# Patient Record
Sex: Female | Born: 1945 | Race: Black or African American | Hispanic: No | Smoking: Never smoker
Health system: Southern US, Community
[De-identification: ages and names within clinical notes are randomized; demographics above are authoritative.]

## PROBLEM LIST (undated history)

## (undated) DIAGNOSIS — M1990 Unspecified osteoarthritis, unspecified site: Secondary | ICD-10-CM

## (undated) DIAGNOSIS — H269 Unspecified cataract: Secondary | ICD-10-CM

## (undated) DIAGNOSIS — I252 Old myocardial infarction: Secondary | ICD-10-CM

## (undated) DIAGNOSIS — I509 Heart failure, unspecified: Secondary | ICD-10-CM

## (undated) DIAGNOSIS — R0683 Snoring: Secondary | ICD-10-CM

## (undated) DIAGNOSIS — E119 Type 2 diabetes mellitus without complications: Secondary | ICD-10-CM

## (undated) DIAGNOSIS — T7840XA Allergy, unspecified, initial encounter: Secondary | ICD-10-CM

## (undated) DIAGNOSIS — R600 Localized edema: Secondary | ICD-10-CM

## (undated) DIAGNOSIS — D649 Anemia, unspecified: Secondary | ICD-10-CM

## (undated) DIAGNOSIS — E861 Hypovolemia: Secondary | ICD-10-CM

## (undated) DIAGNOSIS — I10 Essential (primary) hypertension: Secondary | ICD-10-CM

## (undated) DIAGNOSIS — I2510 Atherosclerotic heart disease of native coronary artery without angina pectoris: Secondary | ICD-10-CM

## (undated) DIAGNOSIS — E782 Mixed hyperlipidemia: Secondary | ICD-10-CM

## (undated) HISTORY — PX: OTHER SURGICAL HISTORY: SHX169

## (undated) HISTORY — DX: Localized edema: R60.0

## (undated) HISTORY — DX: Allergy, unspecified, initial encounter: T78.40XA

## (undated) HISTORY — PX: EYE SURGERY: SHX253

## (undated) HISTORY — DX: Unspecified osteoarthritis, unspecified site: M19.90

## (undated) HISTORY — PX: CARDIAC SURGERY: SHX584

## (undated) HISTORY — DX: Heart failure, unspecified: I50.9

## (undated) HISTORY — DX: Mixed hyperlipidemia: E78.2

## (undated) HISTORY — DX: Snoring: R06.83

## (undated) HISTORY — DX: Hypovolemia: E86.1

## (undated) HISTORY — DX: Anemia, unspecified: D64.9

---

## 2016-12-30 ENCOUNTER — Emergency Department (HOSPITAL_COMMUNITY): Payer: Medicare Other

## 2016-12-30 ENCOUNTER — Emergency Department (HOSPITAL_COMMUNITY)
Admission: EM | Admit: 2016-12-30 | Discharge: 2016-12-30 | Disposition: A | Discharge status: Home / Self Care | Payer: Medicare Other | Attending: Emergency Medicine | Admitting: Emergency Medicine

## 2016-12-30 ENCOUNTER — Encounter (HOSPITAL_COMMUNITY): Payer: Self-pay | Admitting: Emergency Medicine

## 2016-12-30 DIAGNOSIS — Y9259 Other trade areas as the place of occurrence of the external cause: Secondary | ICD-10-CM | POA: Diagnosis not present

## 2016-12-30 DIAGNOSIS — W108XXA Fall (on) (from) other stairs and steps, initial encounter: Secondary | ICD-10-CM | POA: Insufficient documentation

## 2016-12-30 DIAGNOSIS — Y9301 Activity, walking, marching and hiking: Secondary | ICD-10-CM | POA: Diagnosis not present

## 2016-12-30 DIAGNOSIS — S161XXA Strain of muscle, fascia and tendon at neck level, initial encounter: Secondary | ICD-10-CM | POA: Insufficient documentation

## 2016-12-30 DIAGNOSIS — R0789 Other chest pain: Secondary | ICD-10-CM | POA: Diagnosis not present

## 2016-12-30 DIAGNOSIS — S0990XA Unspecified injury of head, initial encounter: Secondary | ICD-10-CM | POA: Insufficient documentation

## 2016-12-30 DIAGNOSIS — Y998 Other external cause status: Secondary | ICD-10-CM | POA: Diagnosis not present

## 2016-12-30 DIAGNOSIS — Z7982 Long term (current) use of aspirin: Secondary | ICD-10-CM | POA: Insufficient documentation

## 2016-12-30 DIAGNOSIS — Z79899 Other long term (current) drug therapy: Secondary | ICD-10-CM | POA: Diagnosis not present

## 2016-12-30 DIAGNOSIS — W19XXXA Unspecified fall, initial encounter: Secondary | ICD-10-CM

## 2016-12-30 LAB — BASIC METABOLIC PANEL
ANION GAP: 8 (ref 5–15)
BUN: 17 mg/dL (ref 6–20)
CHLORIDE: 103 mmol/L (ref 101–111)
CO2: 26 mmol/L (ref 22–32)
Calcium: 9.3 mg/dL (ref 8.9–10.3)
Creatinine, Ser: 0.79 mg/dL (ref 0.44–1.00)
GFR calc non Af Amer: >60 mL/min (ref >60)
Glucose, Bld: 145 mg/dL — ABNORMAL HIGH (ref 65–99)
Potassium: 3.7 mmol/L (ref 3.5–5.1)
SODIUM: 137 mmol/L (ref 135–145)

## 2016-12-30 LAB — PROTIME-INR
INR: 0.93
PROTHROMBIN TIME: 12.3 s (ref 11.4–15.2)

## 2016-12-30 LAB — TROPONIN I

## 2016-12-30 LAB — CBC
HCT: 35.5 % — ABNORMAL LOW (ref 36.0–46.0)
HEMOGLOBIN: 11.9 g/dL — AB (ref 12.0–15.0)
MCH: 27.3 pg (ref 26.0–34.0)
MCHC: 33.5 g/dL (ref 30.0–36.0)
MCV: 81.4 fL (ref 78.0–100.0)
PLATELETS: 189 10*3/uL (ref 150–400)
RBC: 4.36 MIL/uL (ref 3.87–5.11)
RDW: 16.1 % — ABNORMAL HIGH (ref 11.5–15.5)
WBC: 5.4 10*3/uL (ref 4.0–10.5)

## 2016-12-30 LAB — I-STAT TROPONIN, ED: TROPONIN I, POC: 0 ng/mL (ref 0.00–0.08)

## 2016-12-30 NOTE — Discharge Instructions (Signed)
Expect to be sore and stiff for the next few days.  Workup here without any acute injuries.  Take Tylenol for the pain.  Follow-up for any new or worse symptoms.

## 2016-12-30 NOTE — ED Notes (Signed)
Patient given discharge instructions and verbalized understanding.  Patient stable to discharge at this time.  Patient is alert and oriented to baseline.  No distressed noted at this time.  All belongings taken with the patient at discharge.   

## 2016-12-30 NOTE — ED Provider Notes (Signed)
[facility] EMERGENCY DEPARTMENT Provider Note   CSN: [id number] Arrival date & time: 12/30/16  1153     History   Chief Complaint Chief Complaint  Patient presents with  . Chest Pain    HPI Dana Beltran is a 71 y.o. female.  [facility] the science center and was going downstairs and tripped and hit her forehead on the wall.  Patient with complaint of some mild neck stiffness and pain.  Also right-sided chest pain that started right after the fall.  Patient went to [facility] for treatment.  And was referred here for further evaluation.  I think they were concerned about the right-sided chest pain.  Was brought in by EMS after 911 call.  Patient was given 324 mg of aspirin.  No loss of consciousness      History reviewed. No pertinent past medical history.  There are no active problems to display for this patient.   History reviewed. No pertinent surgical history.  OB History    No data available       Home Medications    Prior to Admission medications   Medication Sig Start Date End Date Taking? Authorizing Provider  amLODipine (NORVASC) 10 MG tablet Take 10 mg by mouth daily. 10/17/16  Yes [provider]  Ascorbic Acid (VITAMIN C) 1000 MG tablet Take 1,000 mg by mouth daily.   Yes [provider]  aspirin EC 81 MG tablet Take 81 mg by mouth daily.   Yes [provider]  atorvastatin (LIPITOR) 40 MG tablet Take 40 mg by mouth daily. 10/17/16  Yes [provider]  cholecalciferol (VITAMIN D) 1000 units tablet Take 1,000 Units by mouth daily.   Yes [provider]  co-enzyme Q-10 50 MG capsule Take 50 mg by mouth daily.   Yes [provider]  metoprolol succinate (TOPROL-XL) 25 MG 24 hr tablet Take 25 mg by mouth daily.   Yes [provider]    Family History History reviewed. No pertinent family history.  Social History Social History   Tobacco Use  . Smoking  status: Not on file  Substance Use Topics  . Alcohol use: Not on file  . Drug use: Not on file     Allergies   Chocolate and Citrus   Review of Systems Review of Systems  Constitutional: Negative for fever.  HENT: Negative for congestion.   Eyes: Negative for visual disturbance.  Respiratory: Negative for shortness of breath.   Cardiovascular: Positive for chest pain.  Gastrointestinal: Negative for abdominal pain.  Genitourinary: Negative for dysuria.  Musculoskeletal: Positive for neck pain. Negative for back pain.  Neurological: Positive for headaches. Negative for syncope.     Physical Exam Updated Vital Signs BP (!) 162/88   Pulse (!) 54   Temp 98.3 F (36.8 C) (Oral)   Resp (!) 22   Ht 1.575 m (5\' 2" )   Wt 102.1 kg (225 lb)   SpO2 98%   BMI 41.15 kg/m   Physical Exam  Constitutional: She is oriented to person, place, and time. She appears well-developed and well-nourished. No distress.  HENT:  Large hematoma on right side of forehead probably measuring about 6 x 6 cm little bit of contusion no abrasion  Eyes: Conjunctivae and EOM are normal. Pupils are equal, round, and reactive to light.  Neck: Normal range of motion.  Mild tenderness to palpation posterior cervical spine  Cardiovascular: Normal rate, regular rhythm and normal heart sounds.  Pulmonary/Chest: Effort normal and breath sounds normal. No respiratory distress. She exhibits tenderness.  Reproducible tenderness right anterior chest no crepitance  Abdominal: Soft. Bowel sounds are normal. There is no tenderness.  Musculoskeletal: Normal range of motion. She exhibits no tenderness.  Neurological: She is alert and oriented to person, place, and time. No cranial nerve deficit or sensory deficit. She exhibits normal muscle tone. Coordination normal.  Skin: Skin is warm.  Nursing note and vitals reviewed.    ED Treatments / Results  Labs (all labs ordered are listed, but only abnormal results are  displayed) Labs Reviewed  BASIC METABOLIC PANEL - Abnormal; Notable for the following components:      Result Value   Glucose, Bld 145 (*)    All other components within normal limits  CBC - Abnormal; Notable for the following components:   Hemoglobin 11.9 (*)    HCT 35.5 (*)    RDW 16.1 (*)    All other components within normal limits  TROPONIN I  PROTIME-INR  I-STAT TROPONIN, ED    EKG  EKG Interpretation  Date/Time:  Tuesday December 30 2016 12:10:53 EST Ventricular Rate:  57 PR Interval:    QRS Duration: 95 QT Interval:  420 QTC Calculation: 409 R Axis:   25 Text Interpretation:  Sinus rhythm Probable left ventricular hypertrophy Abnormal T, consider ischemia, lateral leads No previous ECGs available Confirmed by Vanetta MuldersZackowski, Lylith Bebeau [id number]) on 12/30/2016 12:18:19 PM       Radiology Dg Chest 2 View  Result Date: 12/30/2016 CLINICAL DATA:  Larey SeatFell with right chest discomfort. EXAM: CHEST  2 VIEW COMPARISON:  None. FINDINGS: Heart size is mildly enlarged. The lungs are clear without a pneumothorax. Trachea is midline. No large pleural effusions. Multilevel degenerative changes in the thoracic spine with bridging osteophytes. Ribs appear to be grossly intact on these chest images. IMPRESSION: No active cardiopulmonary disease. Mild cardiomegaly. Electronically Signed   By: Richarda OverlieAdam  Henn M.D.   On: 12/30/2016 13:34   Ct Head Wo Contrast  Result Date: 12/30/2016 CLINICAL DATA:  Head injury after fall. EXAM: CT HEAD WITHOUT CONTRAST CT CERVICAL SPINE WITHOUT CONTRAST TECHNIQUE: Multidetector CT imaging of the head and cervical spine was performed following the standard protocol without intravenous contrast. Multiplanar CT image reconstructions of the cervical spine were also generated. COMPARISON:  None. FINDINGS: CT HEAD FINDINGS Brain: No evidence of acute infarction, hemorrhage, hydrocephalus, extra-axial collection or mass lesion/mass effect. Vascular: No hyperdense vessel or  unexpected calcification. Skull: Normal. Negative for fracture or focal lesion. Sinuses/Orbits: No acute finding. Other: Mild right frontal scalp hematoma is noted. CT CERVICAL SPINE FINDINGS Alignment: Normal. Skull base and vertebrae: No fracture is noted. Large anterior osteophyte formation is noted at C3-4, C4-5, C5-6 and C6-7. Soft tissues and spinal canal: No prevertebral fluid or swelling. No visible canal hematoma. Disc levels: Mild degenerative disc disease is noted at C3-4 and C6-7. Upper chest: Negative. Other: None. IMPRESSION: Mild right frontal scalp hematoma. No acute intracranial abnormality seen. Multilevel degenerative disc disease. No acute abnormality seen in the cervical spine. Electronically Signed   By: Lupita RaiderJames  Green Jr, M.D.   On: 12/30/2016 14:38   Ct Cervical Spine Wo Contrast  Result Date: 12/30/2016 CLINICAL DATA:  Head injury after fall. EXAM: CT HEAD WITHOUT CONTRAST CT CERVICAL SPINE WITHOUT CONTRAST TECHNIQUE: Multidetector CT imaging of the head and cervical spine was performed following the standard protocol without intravenous contrast. Multiplanar CT image reconstructions of the cervical spine were also generated. COMPARISON:  None. FINDINGS: CT HEAD FINDINGS Brain: No evidence of acute infarction, hemorrhage, hydrocephalus, extra-axial collection or mass lesion/mass effect. Vascular: No hyperdense vessel or unexpected calcification. Skull: Normal. Negative for fracture or focal lesion. Sinuses/Orbits: No acute finding. Other: Mild right frontal scalp hematoma is noted. CT CERVICAL SPINE FINDINGS Alignment: Normal. Skull base and vertebrae: No fracture is noted. Large anterior osteophyte formation is noted at C3-4, C4-5, C5-6 and C6-7. Soft tissues and spinal canal: No prevertebral fluid or swelling. No visible canal hematoma. Disc levels: Mild degenerative disc disease is noted at C3-4 and C6-7. Upper chest: Negative. Other: None. IMPRESSION: Mild right frontal scalp  hematoma. No acute intracranial abnormality seen. Multilevel degenerative disc disease. No acute abnormality seen in the cervical spine. Electronically Signed   By: Lupita RaiderJames  Green Jr, M.D.   On: 12/30/2016 14:38    Procedures Procedures (including critical care time)  Medications Ordered in ED Medications - No data to display   Initial Impression / Assessment and Plan / ED Course  I have reviewed the triage vital signs and the nursing notes.  Pertinent labs & imaging results that were available during my care of the patient were reviewed by me and considered in my medical decision making (see chart for details).    Patient symptoms all seem to be related to the fall.  I think the chest pain is due to a right chest wall pain from the fall.  Very reproducible.  Patient's EKG negative troponin negative but there is some question with the patient was on blood thinners.  But INR is normal.  Head CT negative CT of cervical spine negative.  Patient may have some contusions to the ribs on the right side.  Chest x-ray showed no evidence of pneumothorax.  No obvious evidence of rib fractures.  Patient stable for discharge home.  Patient offered hydrocodone but patient does not want anything other than Tylenol.   Final Clinical Impressions(s) / ED Diagnoses   Final diagnoses:  Fall, initial encounter  Injury of head, initial encounter  Cervical strain, acute, initial encounter  Chest wall pain    ED Discharge Orders    None       Vanetta MuldersZackowski, Merissa Renwick, MD 12/30/16 1456

## 2016-12-30 NOTE — ED Triage Notes (Signed)
Per EMS pt was at the science center and tripped over a stair and hit her forehead and has a hematoma.  [facility] and started having Right sided chest pain that does not radiate anywhere. The med center called 911.  In route via EMS she received 324 Asprin.  AOx4 NAD noted at this time.

## 2018-04-07 ENCOUNTER — Other Ambulatory Visit: Payer: Self-pay

## 2018-04-07 DIAGNOSIS — I10 Essential (primary) hypertension: Secondary | ICD-10-CM

## 2018-04-07 MED ORDER — AMLODIPINE BESYLATE 10 MG PO TABS: 10.0000 mg | ORAL_TABLET | Freq: Every day | ORAL | 3 refills | Status: DC

## 2018-06-24 ENCOUNTER — Other Ambulatory Visit: Payer: Self-pay

## 2018-09-05 ENCOUNTER — Other Ambulatory Visit: Payer: Self-pay | Admitting: Cardiology

## 2018-10-30 ENCOUNTER — Emergency Department (HOSPITAL_COMMUNITY)
Admission: EM | Admit: 2018-10-30 | Discharge: 2018-10-30 | Disposition: A | Discharge status: Home / Self Care | Payer: Medicare Other | Attending: Emergency Medicine | Admitting: Emergency Medicine

## 2018-10-30 ENCOUNTER — Encounter (HOSPITAL_COMMUNITY): Payer: Self-pay | Admitting: *Deleted

## 2018-10-30 ENCOUNTER — Other Ambulatory Visit: Payer: Self-pay

## 2018-10-30 ENCOUNTER — Emergency Department (HOSPITAL_COMMUNITY): Payer: Medicare Other

## 2018-10-30 DIAGNOSIS — I2510 Atherosclerotic heart disease of native coronary artery without angina pectoris: Secondary | ICD-10-CM | POA: Insufficient documentation

## 2018-10-30 DIAGNOSIS — Z79899 Other long term (current) drug therapy: Secondary | ICD-10-CM | POA: Diagnosis not present

## 2018-10-30 DIAGNOSIS — I252 Old myocardial infarction: Secondary | ICD-10-CM | POA: Diagnosis not present

## 2018-10-30 DIAGNOSIS — I10 Essential (primary) hypertension: Secondary | ICD-10-CM | POA: Insufficient documentation

## 2018-10-30 DIAGNOSIS — R51 Headache: Secondary | ICD-10-CM | POA: Insufficient documentation

## 2018-10-30 DIAGNOSIS — Z7982 Long term (current) use of aspirin: Secondary | ICD-10-CM | POA: Diagnosis not present

## 2018-10-30 DIAGNOSIS — G8929 Other chronic pain: Secondary | ICD-10-CM

## 2018-10-30 HISTORY — DX: Unspecified cataract: H26.9

## 2018-10-30 HISTORY — DX: Old myocardial infarction: I25.2

## 2018-10-30 HISTORY — DX: Atherosclerotic heart disease of native coronary artery without angina pectoris: I25.10

## 2018-10-30 HISTORY — DX: Essential (primary) hypertension: I10

## 2018-10-30 MED ORDER — METOCLOPRAMIDE HCL 5 MG/ML IJ SOLN
10.0000 mg | Freq: Once | INTRAMUSCULAR | Status: DC
  Filled 2018-10-30: qty 2

## 2018-10-30 MED ORDER — DIPHENHYDRAMINE HCL 25 MG PO CAPS
25.0000 mg | ORAL_CAPSULE | Freq: Once | ORAL | Status: AC
  Administered 2018-10-30: 25 mg via ORAL
  Filled 2018-10-30: qty 1

## 2018-10-30 MED ORDER — METOCLOPRAMIDE HCL 10 MG PO TABS
5.0000 mg | ORAL_TABLET | Freq: Once | ORAL | Status: AC
  Administered 2018-10-30: 5 mg via ORAL
  Filled 2018-10-30: qty 1

## 2018-10-30 MED ORDER — DIPHENHYDRAMINE HCL 50 MG/ML IJ SOLN
25.0000 mg | Freq: Once | INTRAMUSCULAR | Status: DC
  Filled 2018-10-30: qty 1

## 2018-10-30 MED ORDER — SODIUM CHLORIDE 0.9 % IV BOLUS: 500.0000 mL | Freq: Once | INTRAVENOUS | Status: DC

## 2018-10-30 NOTE — ED Triage Notes (Signed)
C/o headache onset May states she  Was seen by her pcp on tues and seen to the eye doctor, states she called her doctor yest and told to come to the ed for further eval.

## 2018-10-30 NOTE — ED Notes (Signed)
Dc instructions given to pt pt verbalizes understanding.

## 2018-10-30 NOTE — ED Provider Notes (Signed)
[facility] EMERGENCY DEPARTMENT Provider Note   CSN: [id number] Arrival date & time: 10/30/18  0749     History   Chief Complaint Chief Complaint  Patient presents with  . Headache    HPI Dana Beltran is a 73 y.o. female past medical history of cataracts, CAD, hypertension, MI who presents for evaluation of headache that is been constant for the last 4 months.  She states that initially started in May 2020.  She does not recall any preceding trauma, injury, fall.  She reports that it started out gradual in nature and then progressively worsened.  She has not taken any medication for the symptoms.  She saw her PCP who advised her to go to the ophthalmologist doctor.  At the ophthalmologist, she was noted to have cataracts but no other abnormalities.  She states her headache has continued and that she has not had 1 day where she has been headache free.  She states that she called her family medicine doctor who advised her to come to the emergency department for further evaluation.  She states that she will occasionally get sharp pains to the top of her head.  She describes the headache in the front of her forehead and bilateral sides as well as in the back as a pressure type sensation.  She states it is worsened by laying back or leaning too far forward.  She has not had any fevers, double vision, vision loss, blurry vision.  She states she has not had any associated nausea/vomiting, chest pain, difficulty breathing, numbness/weakness of arms or legs, difficulty walking.      The history is provided by the patient.    Past Medical History:  Diagnosis Date  . Cataract disorder type 14   . Coronary artery disease   . Hypertension   . MI, old     There are no active problems to display for this patient.   History reviewed. No pertinent surgical history.   OB History   No obstetric history on file.      Home Medications    Prior to Admission medications    Medication Sig Start Date End Date Taking? Authorizing Provider  amLODipine (NORVASC) 10 MG tablet Take 1 tablet (10 mg total) by mouth daily. 04/07/18   Adrian Prows, MD  Ascorbic Acid (VITAMIN C) 1000 MG tablet Take 1,000 mg by mouth daily.    [provider]  aspirin EC 81 MG tablet Take 81 mg by mouth daily.    [provider]  atorvastatin (LIPITOR) 40 MG tablet TAKE 1 TABLET BY MOUTH  DAILY 09/06/18   Adrian Prows, MD  cholecalciferol (VITAMIN D) 1000 units tablet Take 1,000 Units by mouth daily.    [provider]  co-enzyme Q-10 50 MG capsule Take 50 mg by mouth daily.    [provider]  metoprolol succinate (TOPROL-XL) 25 MG 24 hr tablet Take 25 mg by mouth daily.    [provider]    Family History No family history on file.  Social History Social History   Tobacco Use  . Smoking status: Never Smoker  . Smokeless tobacco: Never Used  Substance Use Topics  . Alcohol use: Never    Frequency: Never  . Drug use: Never     Allergies   Chocolate and Citrus   Review of Systems Review of Systems  Constitutional: Negative for fever.  Eyes: Negative for visual disturbance.  Respiratory: Negative for shortness of breath.   Cardiovascular:  Negative for chest pain.  Gastrointestinal: Negative for abdominal pain, nausea and vomiting.  Neurological: Positive for headaches. Negative for weakness and numbness.  All other systems reviewed and are negative.    Physical Exam Updated Vital Signs BP 129/71   Pulse 76   Temp 98.6 F (37 C) (Oral)   Resp 16   Ht 5' 2" (1.575 m)   Wt 100.7 kg   SpO2 100%   BMI 40.60 kg/m   Physical Exam Vitals signs and nursing note reviewed.  Constitutional:      Appearance: Normal appearance. She is well-developed.  HENT:     Head: Normocephalic and atraumatic.      Comments: Tenderness palpation on bilateral temples. Eyes:     General: Lids are normal.     Conjunctiva/sclera: Conjunctivae  normal.     Pupils: Pupils are equal, round, and reactive to light.     Comments: PERRL. EOMs intact. No nystagmus. No neglect.   Neck:     Musculoskeletal: Full passive range of motion without pain.     Comments: Neck is supple without rigidity. Cardiovascular:     Rate and Rhythm: Normal rate and regular rhythm.     Pulses: Normal pulses.     Heart sounds: Normal heart sounds. No murmur. No friction rub. No gallop.   Pulmonary:     Effort: Pulmonary effort is normal.     Breath sounds: Normal breath sounds.     Comments: Lungs clear to auscultation bilaterally.  Symmetric chest rise.  No wheezing, rales, rhonchi. Abdominal:     Palpations: Abdomen is soft. Abdomen is not rigid.     Tenderness: There is no abdominal tenderness. There is no guarding.     Comments: Abdomen is soft, non-distended, non-tender. No rigidity, No guarding. No peritoneal signs.  Musculoskeletal: Normal range of motion.  Skin:    General: Skin is warm and dry.     Capillary Refill: Capillary refill takes less than 2 seconds.  Neurological:     Mental Status: She is alert and oriented to person, place, and time.     Comments: Cranial nerves III-XII intact Follows commands, Moves all extremities  5/5 strength to BUE and BLE  Sensation intact throughout all major nerve distributions Normal finger to nose. No pronator drift. No gait abnormalities  No slurred speech. No facial droop.   Psychiatric:        Speech: Speech normal.      ED Treatments / Results  Labs (all labs ordered are listed, but only abnormal results are displayed) Labs Reviewed  SEDIMENTATION RATE    EKG None  Radiology Ct Head Wo Contrast  Result Date: 10/30/2018 CLINICAL DATA:  Headache, chronic. EXAM: CT HEAD WITHOUT CONTRAST TECHNIQUE: Contiguous axial images were obtained from the base of the skull through the vertex without intravenous contrast. COMPARISON:  None. FINDINGS: Brain: No evidence of acute infarction,  hemorrhage, hydrocephalus, extra-axial collection or mass lesion/mass effect. Vascular: No hyperdense vessel or unexpected calcification. Skull: Normal. Negative for fracture or focal lesion. Sinuses/Orbits: No acute finding. Other: None. IMPRESSION: 1. No acute intracranial abnormality.  Normal brain. Electronically Signed   By: Kerby Moors M.D.   On: 10/30/2018 11:25    Procedures Procedures (including critical care time)  Medications Ordered in ED Medications  metoCLOPramide (REGLAN) tablet 5 mg (5 mg Oral Given 10/30/18 1210)  diphenhydrAMINE (BENADRYL) capsule 25 mg (25 mg Oral Given 10/30/18 1209)     Initial Impression / Assessment and Plan / ED  Course  I have reviewed the triage vital signs and the nursing notes.  Pertinent labs & imaging results that were available during my care of the patient were reviewed by me and considered in my medical decision making (see chart for details).        73 year old female who presents for evaluation of headache that is been constant since May 2020.  No preceding trauma, injury, fall.  No associated fevers, vision changes, numbness/weakness of her arms or legs, difficulty walking.  Reports it is worse with laying back or sitting all the way forward. Patient is afebrile, non-toxic appearing, sitting comfortably on examination table. Vital signs reviewed and stable.  No neuro deficits.  History/physical exam concerning for intracranial hemorrhage, meningitis.  Doubt trigeminal neuralgia.  Given continuation of headache for the last several months, will plan for CT head for evaluation of any acute intracranial normality.  CT head negative for any acute abnormality.  Discussed results with patient.  We will plan to give migraine cocktail.  She does not want the IV.  We will give it orally and check ESR.  Patient reports feeling much better after medications here in the ED.  Her ESR has not been drawn yet but patient would like to leave.  I  discussed with her that we wanted to evaluate one more blood test but patient does not wish to have it evaluated.  Patient is ready to leave.  I discussed with patient regarding follow-up with PCP and having them repeat the test.  Patient instructed to follow-up to the emergency department for any worsening or concerning symptoms. At this time, patient exhibits no emergent life-threatening condition that require further evaluation in ED or admission. Patient had ample opportunity for questions and discussion. All patient's questions were answered with full understanding. Strict return precautions discussed. Patient expresses understanding and agreement to plan.    Portions of this note were generated with [employer]. Dictation errors may occur despite best attempts at proofreading.   Final Clinical Impressions(s) / ED Diagnoses   Final diagnoses:  Chronic nonintractable headache, unspecified headache type    ED Discharge Orders    None       Desma Mcgregor 10/30/18 1546    Virgel Manifold, MD 10/31/18 [phone]

## 2018-10-30 NOTE — ED Notes (Signed)
Pt back from ct

## 2018-10-30 NOTE — Discharge Instructions (Signed)
Follow-up with your primary care doctor.  Arrange for an appointment this week to get blood drawn, including an ESR. ° °Return the emergency department for any worsening headache, fevers, blurry vision, numbness/weakness of your arms or legs or any other worsening or concerning symptoms. °

## 2018-11-02 ENCOUNTER — Other Ambulatory Visit: Payer: Self-pay | Admitting: Family Medicine

## 2018-11-02 DIAGNOSIS — G4452 New daily persistent headache (NDPH): Secondary | ICD-10-CM

## 2018-11-03 ENCOUNTER — Encounter: Payer: Self-pay | Admitting: Neurology

## 2018-11-03 ENCOUNTER — Ambulatory Visit (INDEPENDENT_AMBULATORY_CARE_PROVIDER_SITE_OTHER): Payer: Medicare Other | Admitting: Neurology

## 2018-11-03 ENCOUNTER — Other Ambulatory Visit: Payer: Self-pay

## 2018-11-03 VITALS — BP 168/90 | HR 61 | Temp 96.4°F | Ht 62.0 in | Wt 218.3 lb

## 2018-11-03 DIAGNOSIS — G4489 Other headache syndrome: Secondary | ICD-10-CM

## 2018-11-03 MED ORDER — GABAPENTIN 100 MG PO CAPS: ORAL_CAPSULE | ORAL | 1 refills | Status: DC

## 2018-11-03 NOTE — Progress Notes (Signed)
Reason for visit: Headache  Referring physician: Dr. Hyman Bower Beltran is a 73 y.o. female  History of present illness:  Dana Beltran is a 73 year old right-handed black female with a history of headaches that began spontaneously in May 2020.  The patient claims that throughout her life she has never really had headache problems, and the headaches came on completely spontaneously.  The patient has not had any medication changes around the time of onset of the headache.  They were initially occasional but now have become daily in nature.  The headaches are in the temporal regions and around the eyes, left greater than right and in the occipital areas.  The patient may have pain around the ears and at the base of the skull with some occasional sharp pain on the top of the head.  She does note scalp tenderness in the temporal regions and in the back of the head.  She has the headaches more severely in the morning and in the evening, better at midday.  She does not take any medications for the headache.  She reports no nausea or vomiting, vision changes, or photophobia or phonophobia with the headache.  She has not had any numbness or weakness of the face, arms, legs.  She denies balance changes or difficulty controlling the bowels or the bladder.  She does have allergy symptoms that is worse in the fall.  She has had some decline in vision of the left eye, she was seen by Dr. Katy Beltran from ophthalmology, she has a cataract in the left eye.  She is sent to this office for an evaluation.  Past Medical History:  Diagnosis Date  . Cataract disorder type 14   . Coronary artery disease   . Hypertension   . Leg edema, left    after door fell on her at work   . MI, old     Past Surgical History:  Procedure Laterality Date  . CARDIAC SURGERY     Stent placement x2     Family History  Problem Relation Age of Onset  . High blood pressure Father   . Arthritis Father     Social history:  reports  that she has never smoked. She has never used smokeless tobacco. She reports that she does not drink alcohol or use drugs.  Medications:  Prior to Admission medications   Medication Sig Start Date End Date Taking? Authorizing Provider  amLODipine (NORVASC) 10 MG tablet Take 10 mg by mouth daily.   Yes [provider]  ASPIRIN 81 PO Take by mouth.   Yes [provider]  ergocalciferol (VITAMIN D2) 1.25 MG (50000 UT) capsule Take 50,000 Units by mouth once a week.   Yes [provider]  Ginkgo Biloba (GINKOBA) 40 MG TABS Take by mouth.   Yes [provider]  Glucosamine-Chondroit-Vit C-Mn (GLUCOSAMINE 1500 COMPLEX PO) Take by mouth.   Yes [provider]  metoprolol succinate (TOPROL-XL) 50 MG 24 hr tablet Take 50 mg by mouth daily. Take with or immediately following a meal.   Yes [provider]  Multiple Vitamins-Minerals (CENTRUM SILVER PO) Take by mouth.   Yes [provider]  Omega-3 Fatty Acids (FISH OIL PO) Take by mouth.   Yes [provider]  valsartan-hydrochlorothiazide (DIOVAN-HCT) 320-12.5 MG tablet Take 1 tablet by mouth daily.   Yes [provider]      Allergies  Allergen Reactions  . Chocolate Rash  . Citrus Rash  ROS:  Out of a complete 14 system review of symptoms, the patient complains only of the following symptoms, and all other reviewed systems are negative.  Headache Decreased vision Allergies  Blood pressure (!) 168/90, pulse 61, temperature (!) 96.4 F (35.8 C), temperature source Temporal, height 5\' 2"  (1.575 m), weight 218 lb 5 oz (99 kg).  Physical Exam  General: The patient is alert and cooperative at the time of the examination.  The patient is moderately to markedly obese.  Eyes: Pupils are equal, round, and reactive to light. Discs are flat bilaterally.  Neck: The neck is supple, no carotid bruits are noted.  Respiratory: The respiratory examination is clear.   Cardiovascular: The cardiovascular examination reveals a regular rate and rhythm, no obvious murmurs or rubs are noted.  Neuromuscular: Range of movement of the cervical spine lacks only about 10 degrees of full lateral rotation bilaterally.  The patient has no evidence of crepitus in the temporomandibular joints.  Skin: Extremities are with 2+ edema at the ankles bilaterally.  Neurologic Exam  Mental status: The patient is alert and oriented x 3 at the time of the examination. The patient has apparent normal recent and remote memory, with an apparently normal attention span and concentration ability.  Cranial nerves: Facial symmetry is present. There is good sensation of the face to pinprick and soft touch bilaterally. The strength of the facial muscles and the muscles to head turning and shoulder shrug are normal bilaterally. Speech is well enunciated, no aphasia or dysarthria is noted. Extraocular movements are full. Visual fields are full. The tongue is midline, and the patient has symmetric elevation of the soft palate. No obvious hearing deficits are noted.  The patient does have tenderness to palpation around the temporal areas in the back of the head.  Motor: The motor testing reveals 5 over 5 strength of all 4 extremities. Good symmetric motor tone is noted throughout.  Sensory: Sensory testing is intact to pinprick, soft touch, vibration sensation, and position sense on all 4 extremities. No evidence of extinction is noted.  Coordination: Cerebellar testing reveals good finger-nose-finger and heel-to-shin bilaterally.  Gait and station: Gait is normal. Tandem gait is unsteady. Romberg is negative. No drift is seen.  Reflexes: Deep tendon reflexes are symmetric and normal bilaterally. Toes are downgoing bilaterally.   CT head 10/30/18:  IMPRESSION: 1. No acute intracranial abnormality.  Normal brain.  * CT scan images were reviewed online. I agree with the written report.     Assessment/Plan:  1.  Chronic daily headache  The etiology of the headache is not clear, the patient has never had a headache issues previously.  The patient will be sent for further blood work to include a sedimentation rate and CRP.  CT scan of the brain done was normal.  She will be placed on gabapentin taking 100 mg 3 times daily for a week and then go to 200 mg 3 times daily.  She will call for any dose adjustments.  She will follow-up here in about 3 months.  11/01/18 MD 11/03/2018 11:44 AM  [facility] [address] [place], [place]  Phone [phone] Fax [phone]

## 2018-11-03 NOTE — Patient Instructions (Signed)
We will start gabapentin for the headache. Call for any dose adjustments.  Neurontin (gabapentin) may result in drowsiness, ankle swelling, gait instability, or possibly dizziness. Please contact our office if significant side effects occur with this medication.

## 2018-11-04 ENCOUNTER — Telehealth: Payer: Self-pay

## 2018-11-04 LAB — SEDIMENTATION RATE: Sed Rate: 38 mm/hr (ref 0–40)

## 2018-11-04 LAB — C-REACTIVE PROTEIN: CRP: 2 mg/L (ref 0–10)

## 2018-11-04 NOTE — Telephone Encounter (Signed)
I reached out to the pt and advised of lab results. She verbalized understanding and requested atorvastatin  40 mg po daily be added to her med list.  I have added.

## 2018-11-04 NOTE — Telephone Encounter (Signed)
-----   Message from Kathrynn Ducking, MD sent at 11/04/2018  9:48 AM EDT -----  The blood work results are unremarkable. Please call the patient. ----- Message ----- From: Lavone Neri Lab Results In Sent: 11/04/2018   7:37 AM EDT To: Kathrynn Ducking, MD

## 2019-01-05 ENCOUNTER — Telehealth: Payer: Self-pay | Admitting: Neurology

## 2019-01-05 NOTE — Telephone Encounter (Signed)
lvm to r/s 1/7 appt due to NP being out °

## 2019-01-29 ENCOUNTER — Other Ambulatory Visit: Payer: Self-pay | Admitting: Cardiology

## 2019-01-31 ENCOUNTER — Other Ambulatory Visit: Payer: Self-pay | Admitting: Cardiology

## 2019-02-02 ENCOUNTER — Other Ambulatory Visit: Payer: Self-pay | Admitting: Cardiology

## 2019-02-03 NOTE — Telephone Encounter (Signed)
Im not sure ; my allscripts isnt working; she doesn't have a upcoming appointment though

## 2019-02-03 NOTE — Telephone Encounter (Signed)
Not seen since 2018. She either needs an appt or needs to get refills from PCP

## 2019-02-03 NOTE — Telephone Encounter (Signed)
Can I fill these

## 2019-02-10 ENCOUNTER — Ambulatory Visit: Payer: Medicare Other | Admitting: Neurology

## 2019-02-16 ENCOUNTER — Other Ambulatory Visit: Payer: Self-pay | Admitting: Cardiology

## 2019-02-19 ENCOUNTER — Other Ambulatory Visit: Payer: Self-pay | Admitting: Cardiology

## 2019-03-07 ENCOUNTER — Other Ambulatory Visit: Payer: Self-pay | Admitting: Cardiology

## 2019-04-09 ENCOUNTER — Other Ambulatory Visit: Payer: Self-pay | Admitting: Cardiology

## 2019-04-25 ENCOUNTER — Other Ambulatory Visit: Payer: Self-pay | Admitting: Cardiology

## 2019-05-09 ENCOUNTER — Ambulatory Visit: Payer: Self-pay | Admitting: Neurology

## 2019-06-08 ENCOUNTER — Ambulatory Visit: Payer: Self-pay | Admitting: Neurology

## 2019-07-12 ENCOUNTER — Other Ambulatory Visit: Payer: Self-pay | Admitting: Cardiology

## 2019-07-28 ENCOUNTER — Other Ambulatory Visit: Payer: Self-pay | Admitting: Cardiology

## 2019-07-29 NOTE — Telephone Encounter (Signed)
Patient needs appt

## 2019-08-12 ENCOUNTER — Other Ambulatory Visit: Payer: Self-pay | Admitting: Cardiology

## 2019-09-30 ENCOUNTER — Other Ambulatory Visit: Payer: Self-pay | Admitting: Family Medicine

## 2019-09-30 DIAGNOSIS — Z1231 Encounter for screening mammogram for malignant neoplasm of breast: Secondary | ICD-10-CM

## 2019-10-20 ENCOUNTER — Other Ambulatory Visit: Payer: Self-pay | Admitting: Cardiology

## 2019-10-21 ENCOUNTER — Ambulatory Visit
Admission: RE | Admit: 2019-10-21 | Discharge: 2019-10-21 | Disposition: A | Discharge status: Home / Self Care | Payer: Medicare Other | Source: Ambulatory Visit | Attending: Family Medicine | Admitting: Family Medicine

## 2019-10-21 ENCOUNTER — Other Ambulatory Visit: Payer: Self-pay

## 2019-10-21 DIAGNOSIS — Z1231 Encounter for screening mammogram for malignant neoplasm of breast: Secondary | ICD-10-CM

## 2019-11-03 ENCOUNTER — Other Ambulatory Visit: Payer: Self-pay | Admitting: Cardiology

## 2019-11-15 ENCOUNTER — Other Ambulatory Visit: Payer: Self-pay

## 2019-11-15 MED ORDER — METOPROLOL SUCCINATE ER 50 MG PO TB24: 50.0000 mg | ORAL_TABLET | Freq: Every day | ORAL | 3 refills | Status: DC

## 2019-12-24 ENCOUNTER — Other Ambulatory Visit: Payer: Self-pay | Admitting: Cardiology

## 2020-02-20 DIAGNOSIS — E1165 Type 2 diabetes mellitus with hyperglycemia: Secondary | ICD-10-CM | POA: Diagnosis not present

## 2020-02-20 DIAGNOSIS — E785 Hyperlipidemia, unspecified: Secondary | ICD-10-CM | POA: Diagnosis not present

## 2020-02-20 DIAGNOSIS — I2510 Atherosclerotic heart disease of native coronary artery without angina pectoris: Secondary | ICD-10-CM | POA: Diagnosis not present

## 2020-02-20 DIAGNOSIS — E1169 Type 2 diabetes mellitus with other specified complication: Secondary | ICD-10-CM | POA: Diagnosis not present

## 2020-02-20 DIAGNOSIS — E7800 Pure hypercholesterolemia, unspecified: Secondary | ICD-10-CM | POA: Diagnosis not present

## 2020-02-20 DIAGNOSIS — I10 Essential (primary) hypertension: Secondary | ICD-10-CM | POA: Diagnosis not present

## 2020-02-28 ENCOUNTER — Other Ambulatory Visit: Payer: Self-pay | Admitting: Cardiology

## 2020-02-29 DIAGNOSIS — E7800 Pure hypercholesterolemia, unspecified: Secondary | ICD-10-CM | POA: Diagnosis not present

## 2020-02-29 DIAGNOSIS — R6889 Other general symptoms and signs: Secondary | ICD-10-CM | POA: Diagnosis not present

## 2020-02-29 DIAGNOSIS — E1169 Type 2 diabetes mellitus with other specified complication: Secondary | ICD-10-CM | POA: Diagnosis not present

## 2020-02-29 DIAGNOSIS — Z1159 Encounter for screening for other viral diseases: Secondary | ICD-10-CM | POA: Diagnosis not present

## 2020-02-29 DIAGNOSIS — I10 Essential (primary) hypertension: Secondary | ICD-10-CM | POA: Diagnosis not present

## 2020-02-29 DIAGNOSIS — I2510 Atherosclerotic heart disease of native coronary artery without angina pectoris: Secondary | ICD-10-CM | POA: Diagnosis not present

## 2020-02-29 DIAGNOSIS — Z7984 Long term (current) use of oral hypoglycemic drugs: Secondary | ICD-10-CM | POA: Diagnosis not present

## 2020-03-09 ENCOUNTER — Other Ambulatory Visit: Payer: Self-pay

## 2020-03-09 ENCOUNTER — Encounter: Payer: Self-pay | Admitting: Cardiology

## 2020-03-09 ENCOUNTER — Ambulatory Visit: Payer: Medicare Other | Admitting: Cardiology

## 2020-03-09 VITALS — BP 144/71 | HR 56 | Temp 97.2°F | Resp 16 | Ht 62.0 in | Wt 210.0 lb

## 2020-03-09 DIAGNOSIS — I2510 Atherosclerotic heart disease of native coronary artery without angina pectoris: Secondary | ICD-10-CM | POA: Insufficient documentation

## 2020-03-09 DIAGNOSIS — I25118 Atherosclerotic heart disease of native coronary artery with other forms of angina pectoris: Secondary | ICD-10-CM | POA: Insufficient documentation

## 2020-03-09 NOTE — Progress Notes (Signed)
   Follow up visit  Subjective:   Dana Beltran, female    DOB: 1945-02-19, 75 y.o.   MRN: [id number]     HPI  Chief Complaint  Patient presents with  . Coronary Artery Disease  . New Patient (Initial Visit)    75 y.o.  female with hypertension, coronary artery disease s/p MI and PCI 2016 (LAD Promus Premier 2.75X28 mm, 3.0X16 mm DES, Xience Alpine 4.0X18 mm DES RCA)   Last seen in our office in 2018.    Current Outpatient Medications on File Prior to Visit  Medication Sig Dispense Refill  . amLODipine (NORVASC) 10 MG tablet TAKE 1 TABLET BY MOUTH  DAILY 90 tablet 3  . ASPIRIN 81 PO Take by mouth.    Marland Kitchen atorvastatin (LIPITOR) 40 MG tablet TAKE 1 TABLET BY MOUTH  DAILY 90 tablet 3  . Biotin 10 MG TABS 1 tablet    . Coenzyme Q10 (CO Q-10) 100 MG CAPS 1 capsule with a meal    . Ginkgo Biloba 40 MG TABS Take by mouth.    . Glucosamine-Chondroit-Vit C-Mn (GLUCOSAMINE 1500 COMPLEX PO) Take by mouth.    . metFORMIN (GLUCOPHAGE-XR) 500 MG 24 hr tablet 1 tablet with meals    . metoprolol succinate (TOPROL-XL) 50 MG 24 hr tablet Take 1 tablet (50 mg total) by mouth daily. Patient needs to schedule f/u appt for further refills. 90 tablet 3  . Multiple Vitamins-Minerals (CENTRUM SILVER PO) Take by mouth.    . valsartan-hydrochlorothiazide (DIOVAN-HCT) 320-12.5 MG tablet TAKE 1 TABLET BY MOUTH  DAILY 90 tablet 3  . Vitamin A 3 MG (10000 UT) TABS 1 tablet     No current facility-administered medications on file prior to visit.    Cardiovascular & other pertient studies:  EKG 03/09/2020: Sinus rhythm 55 bpm Old anteroseptal infarct Lateral T wave inversion, cannot exclude ischemia  Echocardiogram 09/2015: Mod LVH. Hypertensive heart disease vs amyloid. Grade 1 DD Mild LA dilatation Mild TR Small pericardial effusion. No tamponade  Carotid US 09/2015: Minimal stenosis. Rt ICA not visualized B/l carotids demonstrate tortuosity   Recent labs: 02/09/2020: Glucose 154, BUN/Cr 22/0.8.  EGFR 68. HbA1C 7.0% Chol 157, TG 66, HDL 59, LDL 85 TSH 1.5 normal    ROS Not performed      Vitals:   03/09/20 0835  BP: (!) 144/71  Pulse: (!) 56  Resp: 16  Temp: (!) 97.2 F (36.2 C)  SpO2: 99%    Body mass index is 38.41 kg/m. Filed Weights   03/09/20 0835  Weight: 210 lb (95.3 kg)     Objective:   Physical Exam Not performed       Assessment & Recommendations:   75 y.o.  female with hypertension, coronary artery disease s/p MI and PCI 2016 (LAD Promus Premier 2.75X28 mm, 3.0X16 mm DES, Xience Alpine 4.0X18 mm DES RCA)  Appt was at 9 AM. Patient apparently was dropped off by [facility] at 7:30 AM, and left without being seen at 9 AM as her ride back home had arrived and she stated "I cannot afford to miss it".  Will request to reschedule the visit.    Nigel Mormon, MD Pager: [phone] Office: [phone]

## 2020-03-15 ENCOUNTER — Other Ambulatory Visit: Payer: Self-pay | Admitting: Cardiology

## 2020-03-22 NOTE — Progress Notes (Signed)
Patient referred by Aretta Nip, MD for CAD  Subjective:   Dana Beltran, female    DOB: 06/25/1945, 75 y.o.   MRN: [id number]   Chief Complaint  Patient presents with  . Coronary Artery Disease  . New Patient (Initial Visit)    Referred by Milagros Evener, MD     HPI  75 y.o. African American female with hypertension, hyperlipidemia, CAD s/p MI 10/2014 (PPCI LAD 2.75X28 mm & 3.0X16 mm Promus Premier DES), PCI to RCA 11/2014 (Xience Alpine 4.0X18 mm DES)  Patient lives alone. She used to be active with voluntary tutoring, church activities, prior to the pandemic. Since the pandemic began, she has been a lot less active, although she has just begun tutoring again. She denies chest pain, shortness of breath, palpitations, leg edema, orthopnea, PND, TIA/syncope. Blood pressure is elevated today. She admits to missing her medications, when she gets busy with her daily activities.    Past Medical History:  Diagnosis Date  . Cataract disorder type 14   . Coronary artery disease   . Hypertension   . Leg edema, left    after door fell on her at work   . MI, old      Past Surgical History:  Procedure Laterality Date  . CARDIAC SURGERY     Stent placement x2      Social History   Tobacco Use  Smoking Status Never Smoker  Smokeless Tobacco Never Used    Social History   Substance and Sexual Activity  Alcohol Use Never     Family History  Problem Relation Age of Onset  . High blood pressure Father   . Arthritis Father      Current Outpatient Medications on File Prior to Visit  Medication Sig Dispense Refill  . amLODipine (NORVASC) 10 MG tablet TAKE 1 TABLET BY MOUTH  DAILY 90 tablet 3  . ASPIRIN 81 PO Take by mouth.    Marland Kitchen atorvastatin (LIPITOR) 40 MG tablet TAKE 1 TABLET BY MOUTH  DAILY 90 tablet 3  . Biotin 10 MG TABS 1 tablet    . Coenzyme Q10 (CO Q-10) 100 MG CAPS 1 capsule with a meal    . Ginkgo Biloba 40 MG TABS Take by mouth.    .  Glucosamine-Chondroit-Vit C-Mn (GLUCOSAMINE 1500 COMPLEX PO) Take by mouth.    . metFORMIN (GLUCOPHAGE-XR) 500 MG 24 hr tablet 1 tablet with meals    . metoprolol succinate (TOPROL-XL) 50 MG 24 hr tablet Take 1 tablet (50 mg total) by mouth daily. Patient needs to schedule f/u appt for further refills. 90 tablet 3  . Multiple Vitamins-Minerals (CENTRUM SILVER PO) Take by mouth.    . valsartan-hydrochlorothiazide (DIOVAN-HCT) 320-12.5 MG tablet TAKE 1 TABLET BY MOUTH  DAILY 90 tablet 3  . Vitamin A 3 MG (10000 UT) TABS 1 tablet     No current facility-administered medications on file prior to visit.    Cardiovascular and other pertinent studies:  EKG 03/09/2020: Sinus rhythm 55 bpm Old anteroseptal infarct Lateral T wave inversion, cannot exclude ischemia   Echocardiogram 2017:  1. Left ventricle cavity is normal. Moderate concentric hypertrophy of the left ventricle with suggestion of speckled pattern.calculated EF 56% 2. Left atrial cavity is mildly dilated at 4.1 cm. 3. Mild tricuspid regurgitation. No evidence of pulmonary hypertension. 4. Small pericardial effusion with clear fluids. No 2-D evidence of tamponade   Bilateral Carotid 09/26/2015: Minimal stenosis in both the common and proximal internal carotid artery with  diffuse soft plaque. Right distal ICA not visualized on right to difficult anatomy.Bilateral carotid vessels demonstrate trotuosity and may be a source of bruit.     Recent labs: 02/09/2020: Glucose 132, BUN/Cr 22/0.8. EGFR 68. HbA1C 7.0% Chol 157, TG 66, HDL 59, LDL 85 TSH 1.5 normal    Review of Systems  Cardiovascular: Negative for chest pain, dyspnea on exertion, leg swelling, palpitations and syncope.         Vitals:   03/23/20 1231  BP: (!) 153/78  Pulse: 63  Resp: 16  Temp: (!) 97.1 F (36.2 C)  SpO2: 98%     Body mass index is 37.53 kg/m. Filed Weights   03/23/20 1231  Weight: 205 lb 3.2 oz (93.1 kg)     Objective:   Physical  Exam Vitals and nursing note reviewed.  Constitutional:      General: She is not in acute distress. Neck:     Vascular: No JVD.  Cardiovascular:     Rate and Rhythm: Normal rate and regular rhythm.     Pulses:          Dorsalis pedis pulses are 0 on the right side and 0 on the left side.       Posterior tibial pulses are 1+ on the right side and 1+ on the left side.     Heart sounds: Murmur heard.   Harsh midsystolic murmur is present with a grade of 2/6 at the upper right sternal border radiating to the neck.   Pulmonary:     Effort: Pulmonary effort is normal.     Breath sounds: Normal breath sounds. No wheezing or rales.  Musculoskeletal:     Right lower leg: No edema.     Left lower leg: No edema.         Assessment & Recommendations:   75 y.o. African American female with hypertension, hyperlipidemia, CAD s/p MI 10/2014 (PPCI LAD 2.75X28 mm & 3.0X16 mm Promus Premier DES), PCI to RCA 11/2014 (Xience Alpine 4.0X18 mm DES)  CAD without angina: No angina symptoms. Continue Aspirin, statin, metoprolol, amlodipine   Hypertension: BP not controlled. Non-compliance likely.  Arranged for remote patient monitoring through pur pharmacist Manuela Schwartz.  Mixed hyperlipidemia: Fairly well controlled  F/u in 3 months  Thank you for referring the patient to [place]. Please feel free to contact with any questions.   Nigel Mormon, MD Pager: [phone] Office: [phone]

## 2020-03-23 ENCOUNTER — Ambulatory Visit: Payer: Medicare Other | Admitting: Cardiology

## 2020-03-23 ENCOUNTER — Other Ambulatory Visit: Payer: Self-pay

## 2020-03-23 ENCOUNTER — Encounter: Payer: Self-pay | Admitting: Cardiology

## 2020-03-23 VITALS — BP 153/78 | HR 63 | Temp 97.1°F | Resp 16 | Ht 62.0 in | Wt 205.2 lb

## 2020-03-23 DIAGNOSIS — I2510 Atherosclerotic heart disease of native coronary artery without angina pectoris: Secondary | ICD-10-CM | POA: Diagnosis not present

## 2020-03-23 DIAGNOSIS — E782 Mixed hyperlipidemia: Secondary | ICD-10-CM | POA: Insufficient documentation

## 2020-03-23 DIAGNOSIS — I10 Essential (primary) hypertension: Secondary | ICD-10-CM | POA: Diagnosis not present

## 2020-04-05 DIAGNOSIS — I10 Essential (primary) hypertension: Secondary | ICD-10-CM | POA: Diagnosis not present

## 2020-04-12 ENCOUNTER — Other Ambulatory Visit: Payer: Medicare Other

## 2020-04-13 ENCOUNTER — Other Ambulatory Visit: Payer: Self-pay

## 2020-04-13 ENCOUNTER — Ambulatory Visit: Payer: Medicare Other

## 2020-04-13 DIAGNOSIS — I2510 Atherosclerotic heart disease of native coronary artery without angina pectoris: Secondary | ICD-10-CM | POA: Diagnosis not present

## 2020-04-23 NOTE — Progress Notes (Signed)
Called patient, VMbox is full and cannot receive any messages at this time.

## 2020-04-23 NOTE — Progress Notes (Signed)
2nd attempt : Called patient, VMbox is full and cannot receive any messages at this time.

## 2020-04-24 NOTE — Progress Notes (Signed)
3rd attempt : Called patient, VMbox is full and cannot receive any messages at this time.

## 2020-04-30 DIAGNOSIS — I2510 Atherosclerotic heart disease of native coronary artery without angina pectoris: Secondary | ICD-10-CM | POA: Diagnosis not present

## 2020-04-30 DIAGNOSIS — E1169 Type 2 diabetes mellitus with other specified complication: Secondary | ICD-10-CM | POA: Diagnosis not present

## 2020-04-30 DIAGNOSIS — E7800 Pure hypercholesterolemia, unspecified: Secondary | ICD-10-CM | POA: Diagnosis not present

## 2020-04-30 DIAGNOSIS — I10 Essential (primary) hypertension: Secondary | ICD-10-CM | POA: Diagnosis not present

## 2020-04-30 DIAGNOSIS — E785 Hyperlipidemia, unspecified: Secondary | ICD-10-CM | POA: Diagnosis not present

## 2020-04-30 DIAGNOSIS — E1165 Type 2 diabetes mellitus with hyperglycemia: Secondary | ICD-10-CM | POA: Diagnosis not present

## 2020-05-05 DIAGNOSIS — I10 Essential (primary) hypertension: Secondary | ICD-10-CM | POA: Diagnosis not present

## 2020-06-02 DIAGNOSIS — E1165 Type 2 diabetes mellitus with hyperglycemia: Secondary | ICD-10-CM | POA: Diagnosis not present

## 2020-06-02 DIAGNOSIS — E1169 Type 2 diabetes mellitus with other specified complication: Secondary | ICD-10-CM | POA: Diagnosis not present

## 2020-06-02 DIAGNOSIS — E7800 Pure hypercholesterolemia, unspecified: Secondary | ICD-10-CM | POA: Diagnosis not present

## 2020-06-02 DIAGNOSIS — I10 Essential (primary) hypertension: Secondary | ICD-10-CM | POA: Diagnosis not present

## 2020-06-02 DIAGNOSIS — I2510 Atherosclerotic heart disease of native coronary artery without angina pectoris: Secondary | ICD-10-CM | POA: Diagnosis not present

## 2020-06-02 DIAGNOSIS — E785 Hyperlipidemia, unspecified: Secondary | ICD-10-CM | POA: Diagnosis not present

## 2020-06-04 DIAGNOSIS — I10 Essential (primary) hypertension: Secondary | ICD-10-CM | POA: Diagnosis not present

## 2020-06-05 DIAGNOSIS — I10 Essential (primary) hypertension: Secondary | ICD-10-CM | POA: Diagnosis not present

## 2020-06-20 ENCOUNTER — Ambulatory Visit: Payer: Medicare Other | Admitting: Cardiology

## 2020-06-27 DIAGNOSIS — E7800 Pure hypercholesterolemia, unspecified: Secondary | ICD-10-CM | POA: Diagnosis not present

## 2020-06-27 DIAGNOSIS — E1165 Type 2 diabetes mellitus with hyperglycemia: Secondary | ICD-10-CM | POA: Diagnosis not present

## 2020-06-27 DIAGNOSIS — I10 Essential (primary) hypertension: Secondary | ICD-10-CM | POA: Diagnosis not present

## 2020-06-27 DIAGNOSIS — E1169 Type 2 diabetes mellitus with other specified complication: Secondary | ICD-10-CM | POA: Diagnosis not present

## 2020-06-27 DIAGNOSIS — I2510 Atherosclerotic heart disease of native coronary artery without angina pectoris: Secondary | ICD-10-CM | POA: Diagnosis not present

## 2020-06-27 DIAGNOSIS — E785 Hyperlipidemia, unspecified: Secondary | ICD-10-CM | POA: Diagnosis not present

## 2020-07-04 DIAGNOSIS — I10 Essential (primary) hypertension: Secondary | ICD-10-CM | POA: Diagnosis not present

## 2020-07-07 DIAGNOSIS — I10 Essential (primary) hypertension: Secondary | ICD-10-CM | POA: Diagnosis not present

## 2020-07-13 ENCOUNTER — Encounter: Payer: Self-pay | Admitting: Cardiology

## 2020-07-13 ENCOUNTER — Other Ambulatory Visit: Payer: Self-pay

## 2020-07-13 ENCOUNTER — Ambulatory Visit: Payer: Medicare Other | Admitting: Cardiology

## 2020-07-13 VITALS — BP 168/88 | HR 61 | Temp 97.8°F | Resp 16 | Ht 62.0 in | Wt 205.0 lb

## 2020-07-13 DIAGNOSIS — I313 Pericardial effusion (noninflammatory): Secondary | ICD-10-CM | POA: Diagnosis not present

## 2020-07-13 DIAGNOSIS — I2510 Atherosclerotic heart disease of native coronary artery without angina pectoris: Secondary | ICD-10-CM

## 2020-07-13 DIAGNOSIS — E782 Mixed hyperlipidemia: Secondary | ICD-10-CM

## 2020-07-13 DIAGNOSIS — I3139 Other pericardial effusion (noninflammatory): Secondary | ICD-10-CM

## 2020-07-13 DIAGNOSIS — I10 Essential (primary) hypertension: Secondary | ICD-10-CM

## 2020-07-13 NOTE — Progress Notes (Signed)
Patient referred by Aretta Nip, MD for CAD  Subjective:   Dana Beltran, female    DOB: 08/17/1945, 75 y.o.   MRN: [id number]   Chief Complaint  Patient presents with   Hypertension   Coronary Artery Disease   Follow-up    3 months     HPI  75 y.o. African American female with hypertension, hyperlipidemia, CAD s/p MI 10/2014 (PPCI LAD 2.75X28 mm & 3.0X16 mm Promus Premier DES), PCI to RCA 11/2014 (Xience Alpine 4.0X18 mm DES)  Patient denies chest pain, shortness of breath, palpitations, leg edema, orthopnea, PND, TIA/syncope.  Blood pressure is elevated today.  She attributes this to watching congressional hearing last night on TV.  Remote monitoring data are not available to me today.  Initial consultation HPI 03/2020: Patient lives alone. She used to be active with voluntary tutoring, church activities, prior to the pandemic. Since the pandemic began, she has been a lot less active, although she has just begun tutoring again. She denies chest pain, shortness of breath, palpitations, leg edema, orthopnea, PND, TIA/syncope. Blood pressure is elevated today. She admits to missing her medications, when she gets busy with her daily activities.    Current Outpatient Medications on File Prior to Visit  Medication Sig Dispense Refill   amLODipine (NORVASC) 10 MG tablet TAKE 1 TABLET BY MOUTH  DAILY 90 tablet 3   ASPIRIN 81 PO Take by mouth.     atorvastatin (LIPITOR) 40 MG tablet TAKE 1 TABLET BY MOUTH  DAILY (Patient taking differently: Take 80 mg by mouth daily.) 90 tablet 3   Biotin 10 MG TABS 1 tablet     Coenzyme Q10 (CO Q-10) 100 MG CAPS 1 capsule with a meal     Ginkgo Biloba 40 MG TABS Take by mouth.     Glucosamine-Chondroit-Vit C-Mn (GLUCOSAMINE 1500 COMPLEX PO) Take by mouth.     metFORMIN (GLUCOPHAGE-XR) 500 MG 24 hr tablet 1 tablet with meals     metoprolol succinate (TOPROL-XL) 50 MG 24 hr tablet Take 1 tablet (50 mg total) by mouth daily. Patient needs to  schedule f/u appt for further refills. (Patient taking differently: Take 50 mg by mouth daily.) 90 tablet 3   Multiple Vitamins-Minerals (CENTRUM SILVER PO) Take by mouth.     valsartan-hydrochlorothiazide (DIOVAN-HCT) 320-12.5 MG tablet TAKE 1 TABLET BY MOUTH  DAILY 90 tablet 3   Vitamin A 3 MG (10000 UT) TABS 1 tablet     No current facility-administered medications on file prior to visit.    Cardiovascular and other pertinent studies:  Echocardiogram 04/13/2020:  Normal LV systolic function with visual EF 60-65%. Left ventricle cavity  is normal in size. Moderate left ventricular hypertrophy. Normal global  wall motion. Indeterminate diastolic filling pattern, normal LAP.  Left atrial cavity is mildly dilated.  Mild (Grade I) mitral regurgitation.  Mild tricuspid regurgitation. No evidence of pulmonary hypertension. RVSP  measures 30 mmHg.  Mild pulmonic regurgitation.  Moderate pericardial effusion (1cm). There is no hemodynamic significance.  Compared to prior study dated 2017: No significant change except  pericardial effusion was reported mild but now moderate in size.   EKG 03/09/2020: Sinus rhythm 55 bpm Old anteroseptal infarct Lateral T wave inversion, cannot exclude ischemia   Echocardiogram 2017:  1. Left ventricle cavity is normal. Moderate concentric hypertrophy of the left ventricle with suggestion of speckled pattern.calculated EF 56% 2. Left atrial cavity is mildly dilated at 4.1 cm. 3. Mild tricuspid regurgitation. No evidence of pulmonary  hypertension. 4. Small pericardial effusion with clear fluids. No 2-D evidence of tamponade   Bilateral Carotid 09/26/2015: Minimal stenosis in both the common and proximal internal carotid artery with diffuse soft plaque. Right distal ICA not visualized on right to difficult anatomy.Bilateral carotid vessels demonstrate trotuosity and may be a source of bruit.     Recent labs: 02/09/2020: Glucose 132, BUN/Cr 22/0.8. EGFR  68. HbA1C 7.0% Chol 157, TG 66, HDL 59, LDL 85 TSH 1.5 normal    Review of Systems  Cardiovascular:  Negative for chest pain, dyspnea on exertion, leg swelling, palpitations and syncope.        Vitals:   07/13/20 0842 07/13/20 0852  BP: (!) 173/80 (!) 168/88  Pulse: 69 61  Resp: 16   Temp: 97.8 F (36.6 C)   SpO2: 96% 98%    Body mass index is 37.49 kg/m. Filed Weights   07/13/20 0842  Weight: 205 lb (93 kg)     Objective:   Physical Exam Vitals and nursing note reviewed.  Constitutional:      General: She is not in acute distress. Neck:     Vascular: No JVD.  Cardiovascular:     Rate and Rhythm: Normal rate and regular rhythm.     Pulses:          Dorsalis pedis pulses are 0 on the right side and 0 on the left side.       Posterior tibial pulses are 1+ on the right side and 1+ on the left side.     Heart sounds: Murmur heard.  Harsh midsystolic murmur is present with a grade of 2/6 at the upper right sternal border radiating to the neck.  Pulmonary:     Effort: Pulmonary effort is normal.     Breath sounds: Normal breath sounds. No wheezing or rales.  Musculoskeletal:     Right lower leg: No edema.     Left lower leg: No edema.        Assessment & Recommendations:   75 y.o. African American female with hypertension, hyperlipidemia, CAD s/p MI 10/2014 (PPCI LAD 2.75X28 mm & 3.0X16 mm Promus Premier DES), PCI to RCA 11/2014 (Xience Alpine 4.0X18 mm DES)  CAD without angina: No angina symptoms. Continue Aspirin, statin, metoprolol, amlodipine   Hypertension: BP not controlled.  We will follow-up on remote monitoring data and make further adjustments.   Continue remote patient monitoring through pur pharmacist Manuela Schwartz.  Mixed hyperlipidemia: Fairly well controlled  Pericardial effusion: Mild to moderate.  Clinically asymptomatic.  Repeat echocardiogram in 10/2020  F/u in 3 months    Nigel Mormon, MD Pager: [phone] Office:  [phone]

## 2020-08-02 DIAGNOSIS — I10 Essential (primary) hypertension: Secondary | ICD-10-CM | POA: Diagnosis not present

## 2020-08-19 ENCOUNTER — Other Ambulatory Visit: Payer: Self-pay | Admitting: Cardiology

## 2020-08-21 ENCOUNTER — Other Ambulatory Visit: Payer: Self-pay | Admitting: *Deleted

## 2020-08-21 DIAGNOSIS — M810 Age-related osteoporosis without current pathological fracture: Secondary | ICD-10-CM

## 2020-10-18 ENCOUNTER — Other Ambulatory Visit: Payer: Self-pay

## 2020-10-18 DIAGNOSIS — I10 Essential (primary) hypertension: Secondary | ICD-10-CM

## 2020-10-18 DIAGNOSIS — I2510 Atherosclerotic heart disease of native coronary artery without angina pectoris: Secondary | ICD-10-CM

## 2020-10-19 ENCOUNTER — Other Ambulatory Visit: Payer: Medicare (Managed Care)

## 2020-10-26 ENCOUNTER — Ambulatory Visit: Payer: Medicare Other | Admitting: Cardiology

## 2020-11-07 ENCOUNTER — Other Ambulatory Visit: Payer: Self-pay

## 2020-11-07 ENCOUNTER — Ambulatory Visit: Payer: Medicare (Managed Care)

## 2020-11-07 DIAGNOSIS — I10 Essential (primary) hypertension: Secondary | ICD-10-CM

## 2020-11-07 DIAGNOSIS — I2510 Atherosclerotic heart disease of native coronary artery without angina pectoris: Secondary | ICD-10-CM

## 2020-11-13 NOTE — Progress Notes (Signed)
Patient referred by Aretta Nip, MD for CAD  Subjective:   Dana Beltran, female    DOB: Jun 17, 1945, 75 y.o.   MRN: [id number]   Chief Complaint  Patient presents with   Coronary artery disease involving native coronary artery of   Follow-up    3 month   Results    echo     HPI  75 y.o. African American female with hypertension, hyperlipidemia, CAD s/p MI 10/2014 (PPCI LAD 2.75X28 mm & 3.0X16 mm Promus Premier DES), PCI to RCA 11/2014 (Xience Alpine 4.0X18 mm DES)  Avg 138/77 mmHg, HR 55.  Impression has been usual today.  Overall, she is doing well and denies any complaints of chest pain, shortness of breath etc.  Reviewed recent echocardiogram results with the patient, details below.  Initial consultation HPI 03/2020: Patient lives alone. She used to be active with voluntary tutoring, church activities, prior to the pandemic. Since the pandemic began, she has been a lot less active, although she has just begun tutoring again. She denies chest pain, shortness of breath, palpitations, leg edema, orthopnea, PND, TIA/syncope. Blood pressure is elevated today. She admits to missing her medications, when she gets busy with her daily activities.    Current Outpatient Medications on File Prior to Visit  Medication Sig Dispense Refill   amLODipine (NORVASC) 10 MG tablet TAKE 1 TABLET BY MOUTH  DAILY 90 tablet 3   ASPIRIN 81 PO Take by mouth.     atorvastatin (LIPITOR) 40 MG tablet TAKE 1 TABLET BY MOUTH  DAILY (Patient taking differently: Take 80 mg by mouth daily.) 90 tablet 3   Biotin 10 MG TABS 1 tablet     Coenzyme Q10 (CO Q-10) 100 MG CAPS 1 capsule with a meal     Ginkgo Biloba 40 MG TABS Take by mouth.     Glucosamine-Chondroit-Vit C-Mn (GLUCOSAMINE 1500 COMPLEX PO) Take by mouth.     metFORMIN (GLUCOPHAGE-XR) 500 MG 24 hr tablet 1 tablet with meals     metoprolol succinate (TOPROL-XL) 50 MG 24 hr tablet TAKE 1 TABLET BY MOUTH  DAILY 90 tablet 3   Multiple  Vitamins-Minerals (CENTRUM SILVER PO) Take by mouth.     valsartan-hydrochlorothiazide (DIOVAN-HCT) 320-12.5 MG tablet TAKE 1 TABLET BY MOUTH  DAILY 90 tablet 3   Vitamin A 3 MG (10000 UT) TABS 1 tablet     No current facility-administered medications on file prior to visit.    Cardiovascular and other pertinent studies:  EKG 11/14/2020; Sinus rhythm 55 bpm Nonspecific T-abnormality  Echocardiogram 11/07/2020:  Left ventricle cavity is normal in size. Mild concentric hypertrophy of  the left ventricle. Normal global wall motion. Normal LV systolic function  with EF 56%. Doppler evidence of grade I (impaired) diastolic dysfunction,  normal LAP.  Left atrial cavity is mildly dilated.  Trileaflet aortic valve with mild aortic valve leaflet calcification.  Trace aortic stenosis. No regurgitation.  Moderate tricuspid regurgitation. Estimated pulmonary artery systolic  pressure 32 mmHg.  Mild pulmonic regurgitation.  Trace pericardial effusion, less than previous study on 04/13/2020.  Echocardiogram 04/13/2020:  Normal LV systolic function with visual EF 60-65%. Left ventricle cavity  is normal in size. Moderate left ventricular hypertrophy. Normal global  wall motion. Indeterminate diastolic filling pattern, normal LAP.  Left atrial cavity is mildly dilated.  Mild (Grade I) mitral regurgitation.  Mild tricuspid regurgitation. No evidence of pulmonary hypertension. RVSP  measures 30 mmHg.  Mild pulmonic regurgitation.  Moderate pericardial effusion (1cm). There is  no hemodynamic significance.  Compared to prior study dated 2017: No significant change except  pericardial effusion was reported mild but now moderate in size.   EKG 03/09/2020: Sinus rhythm 55 bpm Old anteroseptal infarct Lateral T wave inversion, cannot exclude ischemia   Echocardiogram 2017:  1. Left ventricle cavity is normal. Moderate concentric hypertrophy of the left ventricle with suggestion of speckled  pattern.calculated EF 56% 2. Left atrial cavity is mildly dilated at 4.1 cm. 3. Mild tricuspid regurgitation. No evidence of pulmonary hypertension. 4. Small pericardial effusion with clear fluids. No 2-D evidence of tamponade   Bilateral Carotid 09/26/2015: Minimal stenosis in both the common and proximal internal carotid artery with diffuse soft plaque. Right distal ICA not visualized on right to difficult anatomy.Bilateral carotid vessels demonstrate trotuosity and may be a source of bruit.     Recent labs: 02/09/2020: Glucose 132, BUN/Cr 22/0.8. EGFR 68. HbA1C 7.0% Chol 157, TG 66, HDL 59, LDL 85 TSH 1.5 normal    Review of Systems  Cardiovascular:  Negative for chest pain, dyspnea on exertion, leg swelling, palpitations and syncope.        Vitals:   11/14/20 0945  BP: (!) 150/77  Pulse: 63  Resp: 16  Temp: 97.8 F (36.6 C)  SpO2: 97%    Body mass index is 38.78 kg/m. Filed Weights   11/14/20 0945  Weight: 212 lb (96.2 kg)     Objective:   Physical Exam Vitals and nursing note reviewed.  Constitutional:      General: She is not in acute distress. Neck:     Vascular: No JVD.  Cardiovascular:     Rate and Rhythm: Normal rate and regular rhythm.     Pulses:          Dorsalis pedis pulses are 0 on the right side and 0 on the left side.       Posterior tibial pulses are 1+ on the right side and 1+ on the left side.     Heart sounds: Murmur heard.  Harsh midsystolic murmur is present with a grade of 2/6 at the upper right sternal border radiating to the neck.  Pulmonary:     Effort: Pulmonary effort is normal.     Breath sounds: Normal breath sounds. No wheezing or rales.  Musculoskeletal:     Right lower leg: No edema.     Left lower leg: No edema.        Assessment & Recommendations:   75 y.o. African American female with hypertension, hyperlipidemia, CAD s/p MI 10/2014 (PPCI LAD 2.75X28 mm & 3.0X16 mm Promus Premier DES), PCI to RCA 11/2014 (Xience  Alpine 4.0X18 mm DES)  CAD without angina: No angina symptoms. Continue Aspirin, statin, metoprolol, amlodipine   Hypertension: Fairly well controlled.  She is reluctant to make any changes today.  If continues to have his BP consistently >140 mmHg, will switch metoprolol to labetalol.  Mixed hyperlipidemia: Currently on Lipitor 40 mg daily.  LDL 85, HDL 59 in 02/2020. Increase to Lipitor 80 mg daily.  To be lipid panel in 3 months.  If remains >70, will consider Zetia or Repatha.  Pericardial effusion: Small, no hemodynamic significance.  F/u in 1 year   Nigel Mormon, MD Pager: [phone] Office: [phone]

## 2020-11-14 ENCOUNTER — Ambulatory Visit: Payer: Medicare (Managed Care) | Admitting: Cardiology

## 2020-11-14 ENCOUNTER — Other Ambulatory Visit: Payer: Self-pay

## 2020-11-14 ENCOUNTER — Encounter: Payer: Self-pay | Admitting: Cardiology

## 2020-11-14 VITALS — BP 150/77 | HR 63 | Temp 97.8°F | Resp 16 | Ht 62.0 in | Wt 212.0 lb

## 2020-11-14 DIAGNOSIS — I2510 Atherosclerotic heart disease of native coronary artery without angina pectoris: Secondary | ICD-10-CM

## 2020-11-14 DIAGNOSIS — E782 Mixed hyperlipidemia: Secondary | ICD-10-CM

## 2020-11-14 DIAGNOSIS — I10 Essential (primary) hypertension: Secondary | ICD-10-CM

## 2020-11-14 MED ORDER — ATORVASTATIN CALCIUM 80 MG PO TABS: 80.0000 mg | ORAL_TABLET | Freq: Every day | ORAL | 3 refills | Status: DC

## 2021-01-30 LAB — LIPID PANEL
Chol/HDL Ratio: 2.4 ratio (ref 0.0–4.4)
Cholesterol, Total: 145 mg/dL (ref 100–199)
HDL: 60 mg/dL (ref >39)
LDL Chol Calc (NIH): 72 mg/dL (ref 0–99)
Triglycerides: 66 mg/dL (ref 0–149)
VLDL Cholesterol Cal: 13 mg/dL (ref 5–40)

## 2021-02-05 NOTE — Progress Notes (Signed)
Spoke with patient, gave her lab results, faxed over results as patient requested.

## 2021-02-15 ENCOUNTER — Other Ambulatory Visit: Payer: Medicare Other

## 2021-05-31 ENCOUNTER — Ambulatory Visit
Admission: RE | Admit: 2021-05-31 | Discharge: 2021-05-31 | Disposition: A | Discharge status: Home / Self Care | Payer: Medicare (Managed Care) | Source: Ambulatory Visit | Attending: Internal Medicine | Admitting: Internal Medicine

## 2021-05-31 ENCOUNTER — Other Ambulatory Visit: Payer: Self-pay | Admitting: Internal Medicine

## 2021-05-31 DIAGNOSIS — M549 Dorsalgia, unspecified: Secondary | ICD-10-CM

## 2021-07-11 IMAGING — MG DIGITAL SCREENING BILAT W/ TOMO W/ CAD
8 series · 8 of 24 positions shown · non-contrast
Comparison: None.

CLINICAL DATA: Screening.

EXAM:
DIGITAL SCREENING BILATERAL MAMMOGRAM WITH TOMO AND CAD

[R CC synth-2D]
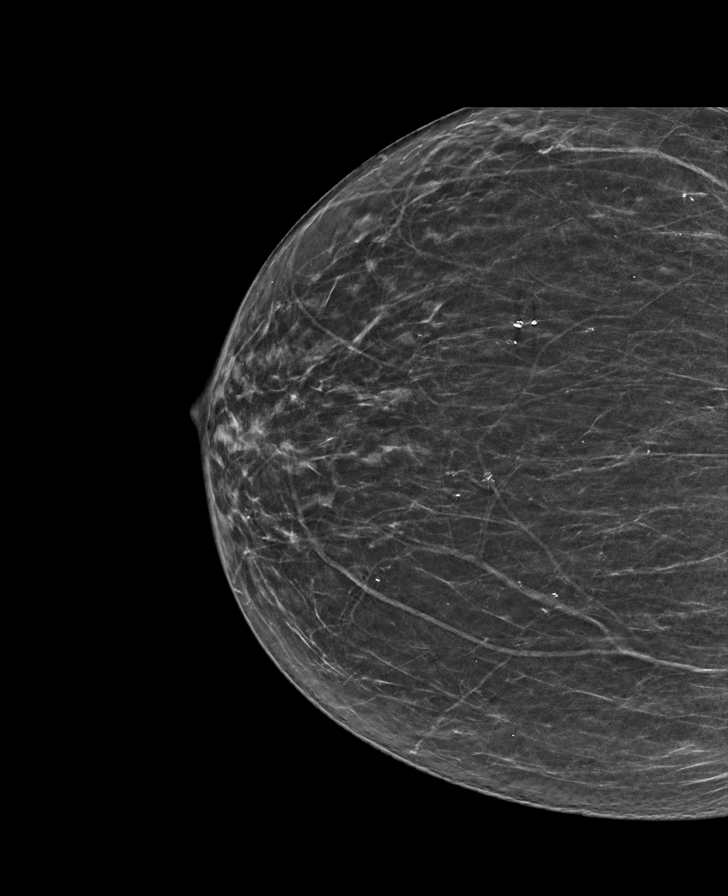

[L CC synth-2D]
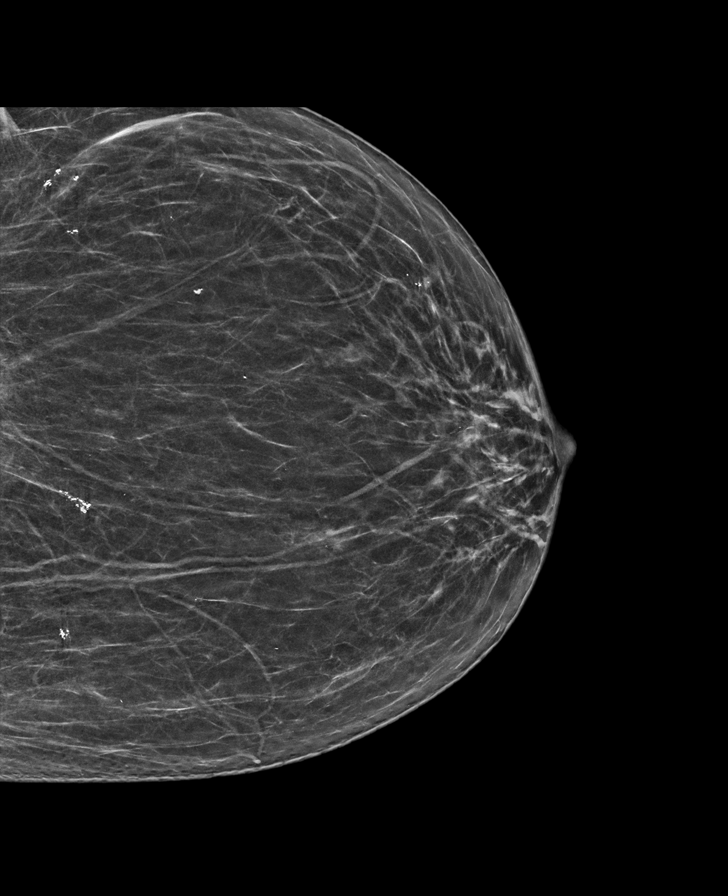

[R MLO synth-2D]
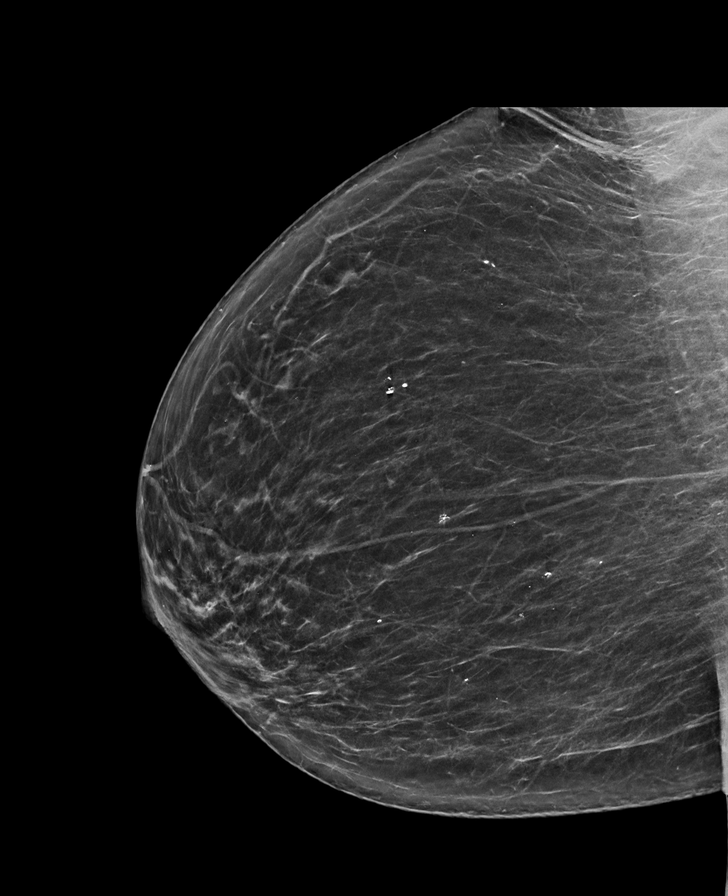

[L MLO synth-2D]
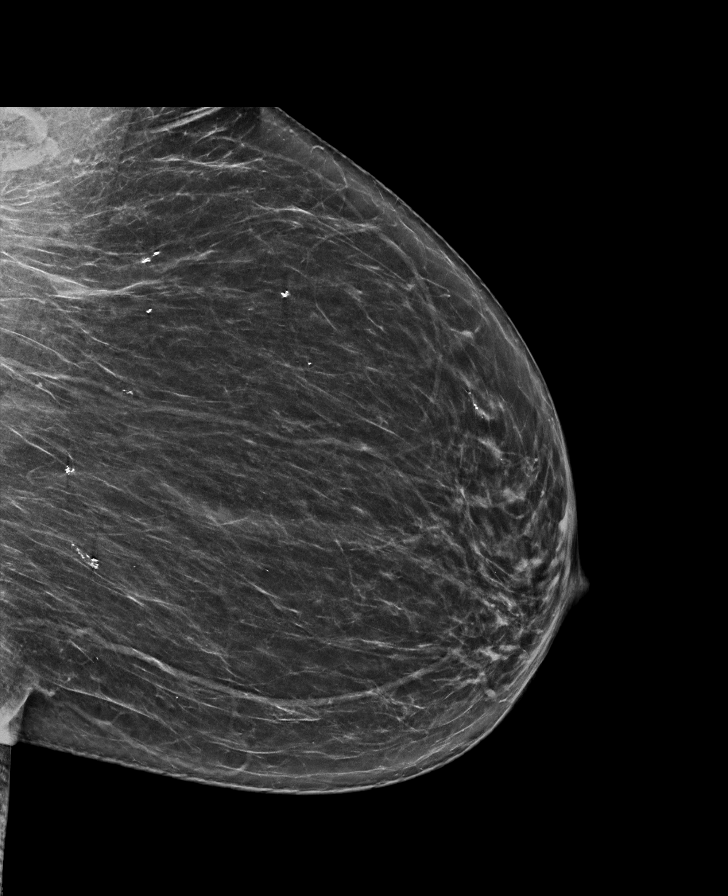

[L MLO tomo · tomo slice 36/71.0]
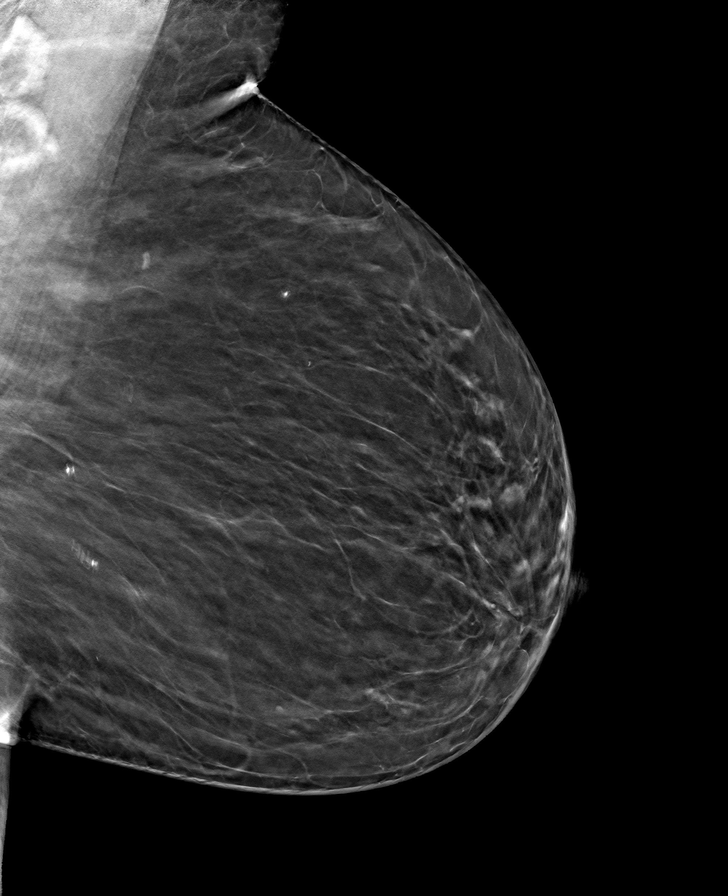

[R MLO tomo · tomo slice 34/67.0]
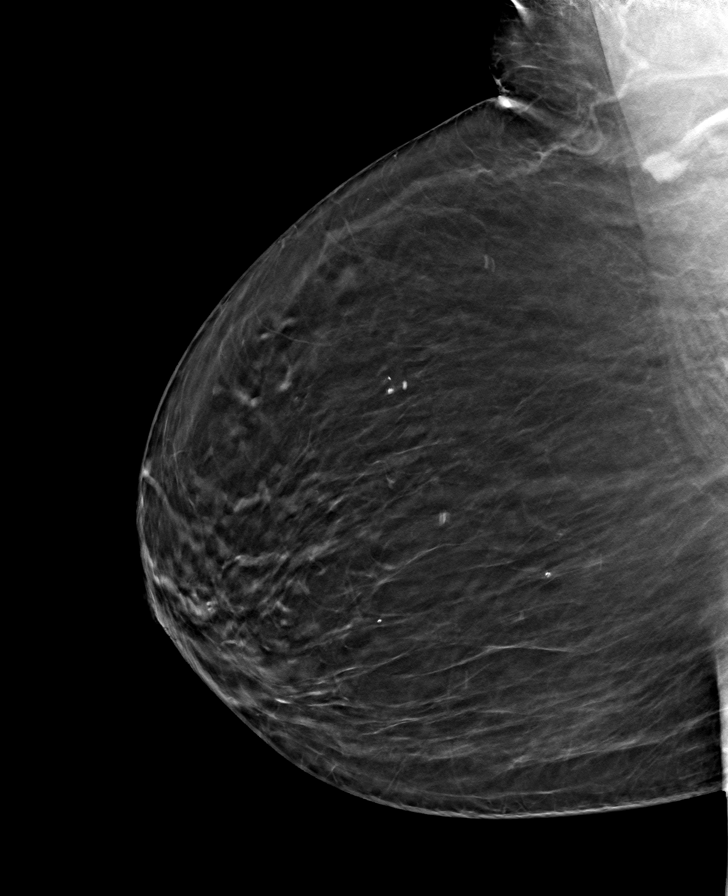

[R CC tomo · tomo slice 29/58.0]
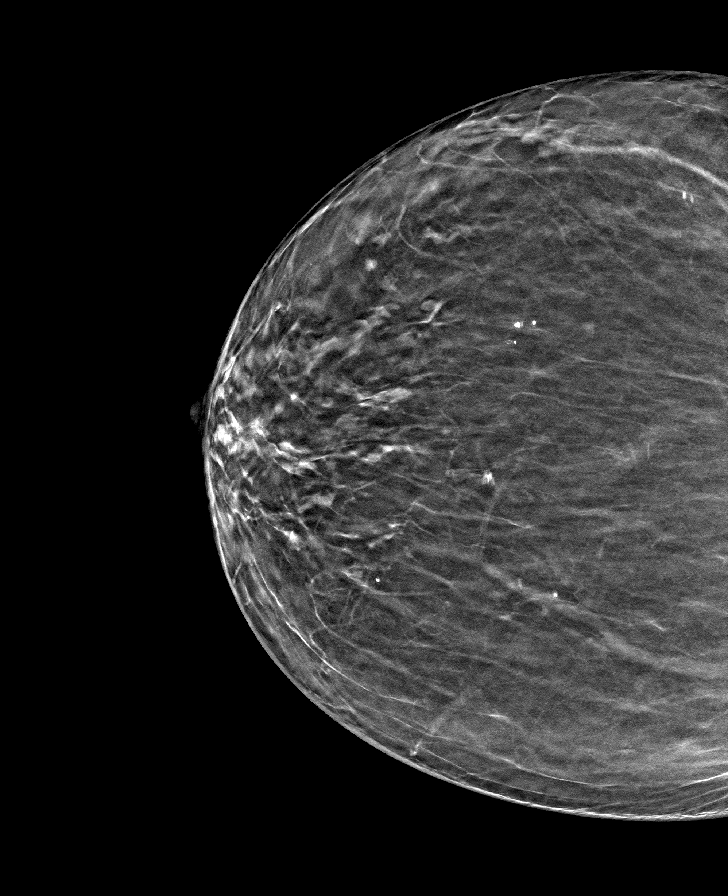

[L CC tomo · tomo slice 30/59.0]
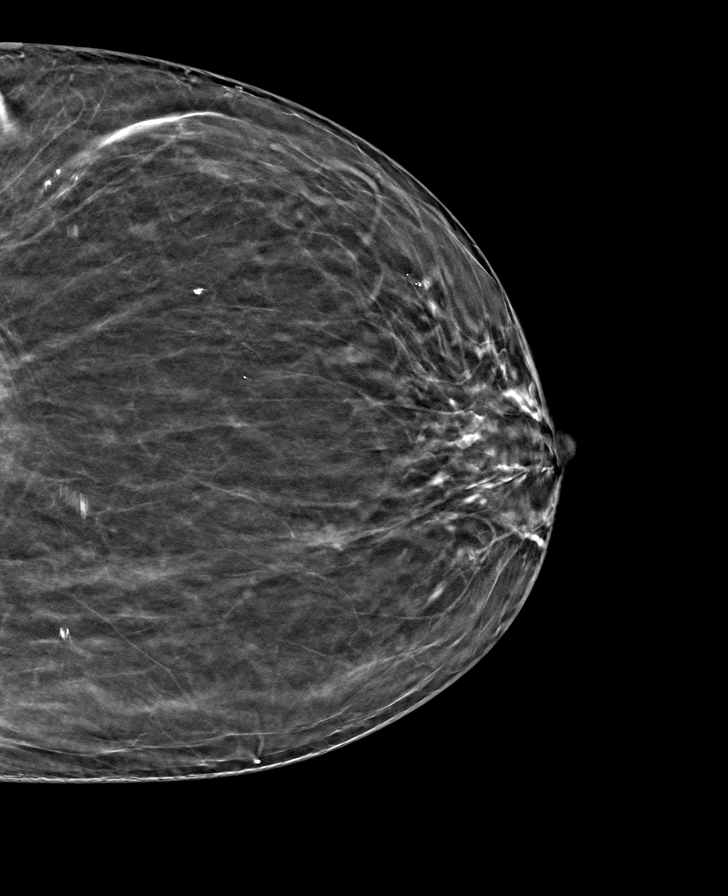

[8 of 24 positions shown; findings below may reference images not displayed]

ACR Breast Density Category b: There are scattered areas of
fibroglandular density.
FINDINGS: There are no findings suspicious for malignancy. Images were
processed with CAD.
IMPRESSION: No mammographic evidence of malignancy. A result letter of this
screening mammogram will be mailed directly to the patient.

RECOMMENDATION:
Screening mammogram in one year. (Code:Y5-G-EJ6)

BI-RADS CATEGORY  1: Negative.

## 2021-11-15 ENCOUNTER — Ambulatory Visit: Payer: Medicare (Managed Care) | Admitting: Cardiology

## 2022-01-03 ENCOUNTER — Ambulatory Visit: Payer: Medicare (Managed Care) | Admitting: Cardiology

## 2022-04-28 ENCOUNTER — Other Ambulatory Visit: Payer: Self-pay | Admitting: Adult Health

## 2022-04-28 DIAGNOSIS — E041 Nontoxic single thyroid nodule: Secondary | ICD-10-CM

## 2022-05-16 ENCOUNTER — Ambulatory Visit
Admission: RE | Admit: 2022-05-16 | Discharge: 2022-05-16 | Disposition: A | Discharge status: Home / Self Care | Payer: Medicare HMO | Source: Ambulatory Visit | Attending: Adult Health

## 2022-05-16 DIAGNOSIS — E041 Nontoxic single thyroid nodule: Secondary | ICD-10-CM

## 2022-05-28 ENCOUNTER — Other Ambulatory Visit: Payer: Self-pay | Admitting: Adult Health

## 2022-05-28 DIAGNOSIS — E041 Nontoxic single thyroid nodule: Secondary | ICD-10-CM

## 2022-06-03 ENCOUNTER — Other Ambulatory Visit: Payer: Self-pay | Admitting: Adult Health

## 2022-06-03 DIAGNOSIS — E041 Nontoxic single thyroid nodule: Secondary | ICD-10-CM

## 2022-07-03 ENCOUNTER — Other Ambulatory Visit (HOSPITAL_COMMUNITY)
Admission: RE | Admit: 2022-07-03 | Discharge: 2022-07-03 | Disposition: A | Discharge status: Home / Self Care | Payer: Medicare HMO | Source: Ambulatory Visit | Attending: Adult Health | Admitting: Adult Health

## 2022-07-03 ENCOUNTER — Ambulatory Visit
Admission: RE | Admit: 2022-07-03 | Discharge: 2022-07-03 | Disposition: A | Discharge status: Home / Self Care | Payer: Medicare HMO | Source: Ambulatory Visit | Attending: Adult Health | Admitting: Adult Health

## 2022-07-03 DIAGNOSIS — E041 Nontoxic single thyroid nodule: Secondary | ICD-10-CM | POA: Diagnosis present

## 2022-07-07 LAB — CYTOLOGY - NON PAP

## 2022-07-18 ENCOUNTER — Encounter (HOSPITAL_COMMUNITY): Payer: Self-pay

## 2022-08-12 ENCOUNTER — Other Ambulatory Visit (HOSPITAL_BASED_OUTPATIENT_CLINIC_OR_DEPARTMENT_OTHER): Payer: Self-pay

## 2022-08-12 DIAGNOSIS — R0683 Snoring: Secondary | ICD-10-CM

## 2022-08-12 DIAGNOSIS — R5383 Other fatigue: Secondary | ICD-10-CM

## 2022-08-12 DIAGNOSIS — R0681 Apnea, not elsewhere classified: Secondary | ICD-10-CM

## 2022-08-12 DIAGNOSIS — I272 Pulmonary hypertension, unspecified: Secondary | ICD-10-CM

## 2022-10-29 ENCOUNTER — Encounter (HOSPITAL_COMMUNITY): Payer: Self-pay

## 2022-10-29 ENCOUNTER — Emergency Department (HOSPITAL_COMMUNITY): Payer: Medicare HMO

## 2022-10-29 ENCOUNTER — Other Ambulatory Visit: Payer: Self-pay

## 2022-10-29 ENCOUNTER — Observation Stay (HOSPITAL_COMMUNITY)
Admission: EM | Admit: 2022-10-29 | Discharge: 2022-10-30 | Disposition: A | Discharge status: Home / Self Care | Payer: Medicare HMO | Attending: Internal Medicine | Admitting: Internal Medicine

## 2022-10-29 DIAGNOSIS — R197 Diarrhea, unspecified: Secondary | ICD-10-CM | POA: Insufficient documentation

## 2022-10-29 DIAGNOSIS — N179 Acute kidney failure, unspecified: Secondary | ICD-10-CM | POA: Diagnosis not present

## 2022-10-29 DIAGNOSIS — Z7984 Long term (current) use of oral hypoglycemic drugs: Secondary | ICD-10-CM | POA: Diagnosis not present

## 2022-10-29 DIAGNOSIS — Z1152 Encounter for screening for COVID-19: Secondary | ICD-10-CM | POA: Insufficient documentation

## 2022-10-29 DIAGNOSIS — R002 Palpitations: Secondary | ICD-10-CM

## 2022-10-29 DIAGNOSIS — Z79899 Other long term (current) drug therapy: Secondary | ICD-10-CM | POA: Insufficient documentation

## 2022-10-29 DIAGNOSIS — I5A Non-ischemic myocardial injury (non-traumatic): Secondary | ICD-10-CM | POA: Diagnosis not present

## 2022-10-29 DIAGNOSIS — E871 Hypo-osmolality and hyponatremia: Secondary | ICD-10-CM | POA: Diagnosis not present

## 2022-10-29 DIAGNOSIS — R7989 Other specified abnormal findings of blood chemistry: Secondary | ICD-10-CM | POA: Insufficient documentation

## 2022-10-29 DIAGNOSIS — E119 Type 2 diabetes mellitus without complications: Secondary | ICD-10-CM | POA: Diagnosis not present

## 2022-10-29 DIAGNOSIS — R079 Chest pain, unspecified: Secondary | ICD-10-CM | POA: Diagnosis present

## 2022-10-29 DIAGNOSIS — Z7982 Long term (current) use of aspirin: Secondary | ICD-10-CM | POA: Insufficient documentation

## 2022-10-29 DIAGNOSIS — I10 Essential (primary) hypertension: Secondary | ICD-10-CM | POA: Insufficient documentation

## 2022-10-29 DIAGNOSIS — D649 Anemia, unspecified: Secondary | ICD-10-CM | POA: Diagnosis not present

## 2022-10-29 DIAGNOSIS — R791 Abnormal coagulation profile: Secondary | ICD-10-CM | POA: Insufficient documentation

## 2022-10-29 DIAGNOSIS — E860 Dehydration: Secondary | ICD-10-CM | POA: Insufficient documentation

## 2022-10-29 DIAGNOSIS — R0602 Shortness of breath: Secondary | ICD-10-CM | POA: Diagnosis not present

## 2022-10-29 DIAGNOSIS — Z95818 Presence of other cardiac implants and grafts: Secondary | ICD-10-CM | POA: Diagnosis not present

## 2022-10-29 DIAGNOSIS — E861 Hypovolemia: Secondary | ICD-10-CM | POA: Diagnosis not present

## 2022-10-29 DIAGNOSIS — I2510 Atherosclerotic heart disease of native coronary artery without angina pectoris: Secondary | ICD-10-CM | POA: Diagnosis not present

## 2022-10-29 DIAGNOSIS — R42 Dizziness and giddiness: Principal | ICD-10-CM

## 2022-10-29 LAB — CBC
HCT: 29.6 % — ABNORMAL LOW (ref 36.0–46.0)
HCT: 27.9 % — ABNORMAL LOW (ref 36.0–46.0)
Hemoglobin: 10 g/dL — ABNORMAL LOW (ref 12.0–15.0)
Hemoglobin: 9.3 g/dL — ABNORMAL LOW (ref 12.0–15.0)
MCH: 27.9 pg (ref 26.0–34.0)
MCH: 27.2 pg (ref 26.0–34.0)
MCHC: 33.8 g/dL (ref 30.0–36.0)
MCHC: 33.3 g/dL (ref 30.0–36.0)
MCV: 82.5 fL (ref 80.0–100.0)
MCV: 81.6 fL (ref 80.0–100.0)
Platelets: 201 10*3/uL (ref 150–400)
Platelets: 132 10*3/uL — ABNORMAL LOW (ref 150–400)
RBC: 3.59 MIL/uL — ABNORMAL LOW (ref 3.87–5.11)
RBC: 3.42 MIL/uL — ABNORMAL LOW (ref 3.87–5.11)
RDW: 15.7 % — ABNORMAL HIGH (ref 11.5–15.5)
RDW: 15.1 % (ref 11.5–15.5)
WBC: 4.6 10*3/uL (ref 4.0–10.5)
WBC: 4.7 10*3/uL (ref 4.0–10.5)
nRBC: 0 % (ref 0.0–0.2)
nRBC: 0 % (ref 0.0–0.2)

## 2022-10-29 LAB — GLUCOSE, CAPILLARY: Glucose-Capillary: 68 mg/dL — ABNORMAL LOW (ref 70–99)

## 2022-10-29 LAB — MAGNESIUM: Magnesium: 1.6 mg/dL — ABNORMAL LOW (ref 1.7–2.4)

## 2022-10-29 LAB — BASIC METABOLIC PANEL
Anion gap: 17 — ABNORMAL HIGH (ref 5–15)
BUN: 37 mg/dL — ABNORMAL HIGH (ref 8–23)
CO2: 19 mmol/L — ABNORMAL LOW (ref 22–32)
Calcium: 9.3 mg/dL (ref 8.9–10.3)
Chloride: 94 mmol/L — ABNORMAL LOW (ref 98–111)
Creatinine, Ser: 2.19 mg/dL — ABNORMAL HIGH (ref 0.44–1.00)
GFR, Estimated: 23 mL/min — ABNORMAL LOW (ref >60)
Glucose, Bld: 86 mg/dL (ref 70–99)
Potassium: 4.2 mmol/L (ref 3.5–5.1)
Sodium: 130 mmol/L — ABNORMAL LOW (ref 135–145)

## 2022-10-29 LAB — TROPONIN I (HIGH SENSITIVITY)
Troponin I (High Sensitivity): 24 ng/L — ABNORMAL HIGH (ref <18)
Troponin I (High Sensitivity): 20 ng/L — ABNORMAL HIGH (ref <18)

## 2022-10-29 LAB — RESP PANEL BY RT-PCR (RSV, FLU A&B, COVID)  RVPGX2
Influenza A by PCR: NEGATIVE
Influenza B by PCR: NEGATIVE
Resp Syncytial Virus by PCR: NEGATIVE
SARS Coronavirus 2 by RT PCR: NEGATIVE

## 2022-10-29 LAB — BRAIN NATRIURETIC PEPTIDE: B Natriuretic Peptide: 85.6 pg/mL (ref 0.0–100.0)

## 2022-10-29 LAB — LACTIC ACID, PLASMA: Lactic Acid, Venous: 2 mmol/L (ref 0.5–1.9)

## 2022-10-29 LAB — D-DIMER, QUANTITATIVE: D-Dimer, Quant: >20 ug/mL-FEU — ABNORMAL HIGH (ref 0.00–0.50)

## 2022-10-29 MED ORDER — LACTATED RINGERS IV BOLUS
1000.0000 mL | Freq: Once | INTRAVENOUS | Status: AC
  Administered 2022-10-29: 1000 mL via INTRAVENOUS

## 2022-10-29 MED ORDER — LACTATED RINGERS IV BOLUS
500.0000 mL | Freq: Once | INTRAVENOUS | Status: AC
  Administered 2022-10-29: 500 mL via INTRAVENOUS

## 2022-10-29 MED ORDER — HEPARIN SODIUM (PORCINE) 5000 UNIT/ML IJ SOLN
5000.0000 [IU] | Freq: Three times a day (TID) | INTRAMUSCULAR | Status: DC
  Administered 2022-10-29 – 2022-10-30 (×2): 5000 [IU] via SUBCUTANEOUS
  Filled 2022-10-29 (×2): qty 1

## 2022-10-29 MED ORDER — INSULIN ASPART 100 UNIT/ML IJ SOLN: 0.0000 [IU] | Freq: Every day | INTRAMUSCULAR | Status: DC

## 2022-10-29 MED ORDER — INSULIN ASPART 100 UNIT/ML IJ SOLN: 0.0000 [IU] | Freq: Three times a day (TID) | INTRAMUSCULAR | Status: DC

## 2022-10-29 MED ORDER — ATORVASTATIN CALCIUM 80 MG PO TABS
80.0000 mg | ORAL_TABLET | Freq: Every day | ORAL | Status: DC
  Administered 2022-10-30: 80 mg via ORAL
  Filled 2022-10-29: qty 1

## 2022-10-29 MED ORDER — ASPIRIN 81 MG PO TBEC
81.0000 mg | DELAYED_RELEASE_TABLET | Freq: Every day | ORAL | Status: DC
  Administered 2022-10-30: 81 mg via ORAL
  Filled 2022-10-29: qty 1

## 2022-10-29 MED ORDER — MAGNESIUM SULFATE 2 GM/50ML IV SOLN
2.0000 g | Freq: Once | INTRAVENOUS | Status: AC
  Administered 2022-10-29: 2 g via INTRAVENOUS
  Filled 2022-10-29: qty 50

## 2022-10-29 MED ORDER — SODIUM CHLORIDE 0.9% FLUSH
3.0000 mL | Freq: Two times a day (BID) | INTRAVENOUS | Status: DC
  Administered 2022-10-29 – 2022-10-30 (×2): 3 mL via INTRAVENOUS

## 2022-10-29 MED ORDER — ACETAMINOPHEN 500 MG PO TABS
1000.0000 mg | ORAL_TABLET | Freq: Four times a day (QID) | ORAL | Status: DC | PRN
  Administered 2022-10-29: 1000 mg via ORAL
  Filled 2022-10-29: qty 2

## 2022-10-29 MED ORDER — LACTATED RINGERS IV SOLN: INTRAVENOUS | Status: AC

## 2022-10-29 NOTE — ED Triage Notes (Addendum)
"  Chest pain and palpitations, shortness of breath started around 1300 today. Earlier this morning she said she had some nausea and felt light headed" per EMS Gave 324mg  asprin in route. On arrival patient states chest pain and other symptoms have resolved at this time.  Patient states she has been doing fasting and only eats once a day for several months and has lost significant amount of weight hoping to be able to come off of her blood pressure and diabetic medications. She has went from 220lb to 150lb per pt.

## 2022-10-29 NOTE — ED Provider Notes (Signed)
Signed out to me at 1530 by Dr. Jarold Motto pending labs and reassessment.  In short this is a 77 year old female with a past medical history of hypertension, CAD presenting to the emergency department with palpitations.  The patient reports that she has been sick the last few days with diarrhea and sore throat.  She states that today she felt like her heart was racing and beating irregularly, felt lightheaded and nauseous.  She states that now that she is laying in the bed her symptoms have improved.  On my evaluation the patient is mildly bradycardic but hemodynamically stable in no acute distress, heart is regular rhythm and lungs are clear to auscultation bilaterally, abdomen is soft and nontender.  She had mildly dry mucous membranes and has received 500 cc of IV fluids.  The patient is an sinus rhythm on arrival here.  Labs are pending at this time.  Clinical Course as of 10/29/22 1811  Wed Oct 29, 2022  1533 Signed out to Dr Theresia Lo [RP]  4010 AKI with Cr 2.19, baseline 1.0 in August 2024. Hgb at baseline. Elevated AGAP likely in the setting of dehydration. Will require admission for AKI. D-dimer is pending but will be unable to obtain CTPE if positive with AKI. [VK]    Clinical Course User Index [RP] Rondel Baton, MD [VK] Rexford Maus, DO      Rexford Maus, [place] 10/29/22 [phone]

## 2022-10-29 NOTE — H&P (Signed)
History and Physical    Thedora Pancoast NFA:[id number] DOB: 1945-08-30 DOA: 10/29/2022  PCP: Margot Ables, MD (Inactive)   Patient coming from: Home   Chief Complaint:  Chief Complaint  Patient presents with   Chest Pain    HPI:  Dana Beltran is a 77 y.o. female with hx of HTN, HLD, DM, CAD/MI, who presents after episode of chest pain and presyncope. Reports 2 days of diarrhea, watery ~5 episodes in past day. No hematochezia / melena, or associated abd pain. 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 Has associated sore throat in past week. Today she was at lunch, and suddenly became nauseous. Got up to go to the bathroom and felt light-headed / presyncopal. Developed central and left sided sharp chest pain which lasted for a few minutes before stopping. No syncope or fall. Had associated SOB as well. Called PCP who called EMS on her behalf.   Review of Systems:  ROS complete and negative except as marked above   Allergies  Allergen Reactions   Caffeine Rash   Coconut (Cocos Nucifera) Rash   Orange Oil Rash   Pineapple Rash   Prunus Persica Rash   Strawberry Extract Rash   Tomato Rash   Chocolate Rash   Citrus Rash   Peach Flavor Rash    Prior to Admission medications   Medication Sig Start Date End Date Taking? Authorizing Provider  amLODipine (NORVASC) 10 MG tablet TAKE 1 TABLET BY MOUTH  DAILY 12/26/19  Yes Yates Decamp, MD  ASPIRIN 81 PO Take by mouth.   Yes [provider]  atorvastatin (LIPITOR) 80 MG tablet Take 1 tablet (80 mg total) by mouth daily. 11/14/20  Yes [name], Anabel Bene, MD  Biotin 10 MG TABS 1 tablet   Yes [provider]  Coenzyme Q10 (CO Q-10) 100 MG CAPS 1 capsule with a meal   Yes [provider]  Ginkgo Biloba 40 MG TABS Take by mouth.   Yes [provider]  Glucosamine-Chondroit-Vit C-Mn (GLUCOSAMINE 1500 COMPLEX PO) Take by mouth.   Yes [provider]  metFORMIN (GLUCOPHAGE-XR) 500 MG 24 hr tablet 1 tablet with meals   Yes [provider]  metoprolol succinate (TOPROL-XL) 50 MG 24 hr tablet TAKE 1 TABLET BY MOUTH  DAILY 08/20/20  Yes Yates Decamp, MD  Multiple Vitamins-Minerals (CENTRUM SILVER PO) Take by mouth.   Yes [provider]  valsartan-hydrochlorothiazide (DIOVAN-HCT) 320-12.5 MG tablet TAKE 1 TABLET BY MOUTH  DAILY 03/16/20  Yes Yates Decamp, MD  Vitamin A 3 MG (10000 UT) TABS 1 tablet   Yes [provider]    Past Medical History:  Diagnosis Date   Cataract disorder type 14    Coronary artery disease    Hypertension    Leg edema, left    after door fell on her at work    MI, old     Past Surgical History:  Procedure Laterality Date   CARDIAC SURGERY     Stent placement x2      reports that she has never smoked. She has never used smokeless tobacco. She reports that she does not drink alcohol and does not use drugs.  Family History  Problem Relation Age of Onset   High blood pressure Father    Arthritis Father      Physical Exam: Vitals:   10/29/22 1432 10/29/22 1629 10/29/22 1715 10/29/22 1800  BP: (!) 123/58 136/63 (!) 123/58 Marland Kitchen)  121/92  Pulse: (!) 50 (!) 57 (!) 54 (!) 58  Resp: (!) 28 17  18   Temp:    98 F (36.7 C)  TempSrc:    Oral  SpO2: 100% 100% 100% 100%  Weight: 68.9 kg     Height: 5\' 2"  (1.575 m)       Gen: Awake, alert, NAD  CV: Regular, normal S1, S2, 3/6 holosystolic murmur radiates to axilla  Resp: Normal WOB, CTAB  Abd: Flat, hyperactive , nontender MSK: Symmetric, no edema  Skin: No rashes or lesions to exposed skin  Neuro: Alert and interactive  Psych: euthymic, appropriate    Data review:   Labs reviewed, notable for:   Trop 24 -> 20  Ddimer > 20   Na 130  HCO3 19, AG 17; lactate pending  Cr 2.1 (b/l ? 0.7)  Mg 1.6 WBC 4 Hb 10   Micro:  Results for orders placed or performed during the hospital encounter of 10/29/22  Resp panel by RT-PCR  (RSV, Flu A&B, Covid) Anterior Nasal Swab     Status: None   Collection Time: 10/29/22  3:28 PM   Specimen: Anterior Nasal Swab  Result Value Ref Range Status   SARS Coronavirus 2 by RT PCR NEGATIVE NEGATIVE Final   Influenza A by PCR NEGATIVE NEGATIVE Final   Influenza B by PCR NEGATIVE NEGATIVE Final    Comment: (NOTE) The Xpert Xpress SARS-CoV-2/FLU/RSV plus assay is intended as an aid in the diagnosis of influenza from Nasopharyngeal swab specimens and should not be used as a sole basis for treatment. Nasal washings and aspirates are unacceptable for Xpert Xpress SARS-CoV-2/FLU/RSV testing.  Fact Sheet for Patients: BloggerCourse.com  Fact Sheet for Healthcare Providers: SeriousBroker.it  This test is not yet approved or cleared by the [place] FDA and has been authorized for detection and/or diagnosis of SARS-CoV-2 by FDA under an Emergency Use Authorization (EUA). This EUA will remain in effect (meaning this test can be used) for the duration of the COVID-19 declaration under Section 564(b)(1) of the Act, 21 U.S.C. section 360bbb-3(b)(1), unless the authorization is terminated or revoked.     Resp Syncytial Virus by PCR NEGATIVE NEGATIVE Final    Comment: (NOTE) Fact Sheet for Patients: BloggerCourse.com  Fact Sheet for Healthcare Providers: SeriousBroker.it  This test is not yet approved or cleared by the [place] FDA and has been authorized for detection and/or diagnosis of SARS-CoV-2 by FDA under an Emergency Use Authorization (EUA). This EUA will remain in effect (meaning this test can be used) for the duration of the COVID-19 declaration under Section 564(b)(1) of the Act, 21 U.S.C. section 360bbb-3(b)(1), unless the authorization is terminated or revoked.  Performed at [facility], [address], [place], [place] [address]     Imaging  reviewed:  DG Chest 2 View  Result Date: 10/29/2022 CLINICAL DATA:  Palpitation shortness of breath EXAM: CHEST - 2 VIEW COMPARISON:  12/30/2016 FINDINGS: Cardiomegaly. No acute airspace disease, pleural effusion, or pneumothorax. IMPRESSION: Cardiomegaly. Electronically Signed   By: Jasmine Pang M.D.   On: 10/29/2022 17:11    EKG:  Sinus brady at 49, ST depression and TWI inferolateral leads    ED Course:  Treated with 1.5 L IVF, Mg   Assessment/Plan:  77 y.o. female with hx HTN, HLD, DM, CAD/MI, who presents after episode of chest pain and presyncope. Recent hx of diarrheal illness.   Chest pain, presyncope  Elevated Ddimer  Acute myocardial injury  EKG  with SB, ST depressions and TWI infero-laterally. Trop downtrending 24 -> 20. Ddimer markedly elevated. Initial impression was demand ischemia from hypovolemia, possibly hypotensive / orthostatic episode. However marked elevation ddimer warrants eval for VTE.  - Duplex LE bilateral  - VQ scan in AM  - Tele monitoring  - Defer TTE pending result of VQ, see below will need in OP setting.  - Continue home aspirin, Atorvastatin. Check lipids and A1c   AKI stage II, suspect prerenal  Hypovolemia AGMA  Baseline Cr in remote past was 0.7. Up to 2.1 on admission. In setting of diarrheal illness, likely prerenal. Orthostatic / presyncopal symptoms per above.  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  - Hold on stool studies for now with presumed viral illness  - If persistent would check C diff, GI pathogen panel   Hyponatremia, likely hypovolemic with low solute  - Repeat in AM   Anemia, normocytic  Hx chronic anemia, remote baseline 11. 10 on admission. No hx bleeding.  - Check iron panel, B12   Hypomagnesemia, asymptomatic  - repleted   Chronic  medical problems:  CAD: Continue home aspirin, Atorvastatin  HTN: Hold home amlodipine, Metoprolol, Valsartan-HCTZ in setting of hypovolemia  DM: Hold home Metformin. SSI while inpatient   Body mass index is 27.8 kg/m.    DVT prophylaxis:  SQ Heparin Code Status:  Full Code Diet:  Diet Orders (From admission, onward)     Start     Ordered   10/29/22 1956  Diet heart healthy/carb modified Room service appropriate? Yes; Fluid consistency: Thin  Diet effective now       Question Answer Comment  Diet-HS Snack? Nothing   Room service appropriate? Yes   Fluid consistency: Thin      10/29/22 2001           Family Communication:  No   Consults:  none   Admission status:   Observation, Telemetry bed  Severity of Illness: The appropriate patient status for this patient is OBSERVATION. Observation status is judged to be reasonable and necessary in order to provide the required intensity of service to ensure the patient's safety. The patient's presenting symptoms, physical exam findings, and initial radiographic and laboratory data in the context of their medical condition is felt to place them at decreased risk for further clinical deterioration. Furthermore, it is anticipated that the patient will be medically stable for discharge from the hospital within 2 midnights of admission.    Dolly Rias, MD [facility]  How to contact the [facility] Attending or Consulting provider 7A - 7P or covering provider during after hours 7P -7A, for this patient.  Check the care team in [facility] and look for a) attending/consulting TR[facility] provider listed and b) the [facility] team listed Log into [email or web address] and use [place]'s universal password to access. If you do not have the password, please contact the hospital operator. Locate the [facility] provider you are looking for under [facility] and page to a number that you can be directly reached. If you still have difficulty reaching the provider, please  page the [facility] (Director on Call) for the Hospitalists listed on amion for assistance.  10/29/2022, 8:22 PM

## 2022-10-29 NOTE — ED Notes (Signed)
ED TO INPATIENT HANDOFF REPORT  ED Nurse Name and Phone #: Randall Hiss [phone]  S Name/Age/Gender Dana Beltran 77 y.o. female Room/Bed: 044C/044C  Code Status   Code Status: Full Code  Home/SNF/Other Home Patient oriented to: self, place, time, and situation Is this baseline? Yes   Triage Complete: Triage complete  Chief Complaint Acute kidney injury [facility]) [N17.9]  Triage Note "Chest pain and palpitations, shortness of breath started around 1300 today. Earlier this morning she said she had some nausea and felt light headed" per EMS Gave 324mg  asprin in route. On arrival patient states chest pain and other symptoms have resolved at this time.  Patient states she has been doing fasting and only eats once a day for several months and has lost significant amount of weight hoping to be able to come off of her blood pressure and diabetic medications. She has went from 220lb to 150lb per pt.    Allergies Allergies  Allergen Reactions   Caffeine Rash   Coconut (Cocos Nucifera) Rash   Orange Oil Rash   Pineapple Rash   Prunus Persica Rash   Strawberry Extract Rash   Tomato Rash   Chocolate Rash   Citrus Rash   Peach Flavor Rash    Level of Care/Admitting Diagnosis ED Disposition     ED Disposition  Admit   Condition  --   Comment  Hospital Area: [facility] [[id number]]  Level of Care: Telemetry Medical [104]  May place patient in observation at [facility] or [facility] if equivalent level of care is available:: Yes  Covid Evaluation: Asymptomatic - no recent exposure (last 10 days) testing not required  Diagnosis: Acute kidney injury [facility]) [[id number]]  Admitting Physician: Dolly Rias [[id number]]  Attending Physician: Dolly Rias [[id number]]          B Medical/Surgery History Past Medical History:  Diagnosis Date   Cataract disorder type 14    Coronary artery disease    Hypertension    Leg edema, left    after door fell on her at  work    MI, old    Past Surgical History:  Procedure Laterality Date   CARDIAC SURGERY     Stent placement x2      A IV Location/Drains/Wounds Patient Lines/Drains/Airways Status     Active Line/Drains/Airways     Name Placement date Placement time Site Days   Peripheral IV 10/29/22 20 G 1" Anterior;Proximal;Right Forearm 10/29/22  1602  Forearm  less than 1            Intake/Output Last 24 hours  Intake/Output Summary (Last 24 hours) at 10/29/2022 2044 Last data filed at 10/29/2022 2038 Gross per 24 hour  Intake 550 ml  Output --  Net 550 ml    Labs/Imaging Results for orders placed or performed during the hospital encounter of 10/29/22 (from the past 48 hour(s))  Resp panel by RT-PCR (RSV, Flu A&B, Covid) Anterior Nasal Swab     Status: None   Collection Time: 10/29/22  3:28 PM   Specimen: Anterior Nasal Swab  Result Value Ref Range   SARS Coronavirus 2 by RT PCR NEGATIVE NEGATIVE   Influenza A by PCR NEGATIVE NEGATIVE   Influenza B by PCR NEGATIVE NEGATIVE    Comment: (NOTE) The Xpert Xpress SARS-CoV-2/FLU/RSV plus assay is intended as an aid in the diagnosis of influenza from Nasopharyngeal swab specimens and should not be used as a sole basis for treatment. Nasal washings and aspirates are unacceptable  for Xpert Xpress SARS-CoV-2/FLU/RSV testing.  Fact Sheet for Patients: BloggerCourse.com  Fact Sheet for Healthcare Providers: SeriousBroker.it  This test is not yet approved or cleared by the [place] FDA and has been authorized for detection and/or diagnosis of SARS-CoV-2 by FDA under an Emergency Use Authorization (EUA). This EUA will remain in effect (meaning this test can be used) for the duration of the COVID-19 declaration under Section 564(b)(1) of the Act, 21 U.S.C. section 360bbb-3(b)(1), unless the authorization is terminated or revoked.     Resp Syncytial Virus by PCR NEGATIVE NEGATIVE     Comment: (NOTE) Fact Sheet for Patients: BloggerCourse.com  Fact Sheet for Healthcare Providers: SeriousBroker.it  This test is not yet approved or cleared by the [place] FDA and has been authorized for detection and/or diagnosis of SARS-CoV-2 by FDA under an Emergency Use Authorization (EUA). This EUA will remain in effect (meaning this test can be used) for the duration of the COVID-19 declaration under Section 564(b)(1) of the Act, 21 U.S.C. section 360bbb-3(b)(1), unless the authorization is terminated or revoked.  Performed at [facility], [address], [place], [place] [address]   Basic metabolic panel     Status: Abnormal   Collection Time: 10/29/22  4:00 PM  Result Value Ref Range   Sodium 130 (L) 135 - 145 mmol/L   Potassium 4.2 3.5 - 5.1 mmol/L    Comment: HEMOLYSIS AT THIS LEVEL MAY AFFECT RESULT   Chloride 94 (L) 98 - 111 mmol/L   CO2 19 (L) 22 - 32 mmol/L   Glucose, Bld 86 70 - 99 mg/dL    Comment: Glucose reference range applies only to samples taken after fasting for at least 8 hours.   BUN 37 (H) 8 - 23 mg/dL   Creatinine, Ser 1.91 (H) 0.44 - 1.00 mg/dL   Calcium 9.3 8.9 - 47.8 mg/dL   GFR, Estimated 23 (L) >60 mL/min    Comment: (NOTE) Calculated using the CKD-EPI Creatinine Equation (2021)    Anion gap 17 (H) 5 - 15    Comment: Performed at [facility], [address], [place], [place] [address]  CBC     Status: Abnormal   Collection Time: 10/29/22  4:00 PM  Result Value Ref Range   WBC 4.6 4.0 - 10.5 K/uL   RBC 3.59 (L) 3.87 - 5.11 MIL/uL   Hemoglobin 10.0 (L) 12.0 - 15.0 g/dL   HCT 13.0 (L) 86.5 - 78.4 %   MCV 82.5 80.0 - 100.0 fL   MCH 27.9 26.0 - 34.0 pg   MCHC 33.8 30.0 - 36.0 g/dL   RDW 69.6 (H) 29.5 - 28.4 %   Platelets 201 150 - 400 K/uL   nRBC 0.0 0.0 - 0.2 %    Comment: Performed at [facility], [address], [place], [place] [address]  Troponin I (High  Sensitivity)     Status: Abnormal   Collection Time: 10/29/22  4:00 PM  Result Value Ref Range   Troponin I (High Sensitivity) 24 (H) <18 ng/L    Comment: (NOTE) Elevated high sensitivity troponin I (hsTnI) values and significant  changes across serial measurements may suggest ACS but many other  chronic and acute conditions are known to elevate hsTnI results.  Refer to the "Links" section for chest pain algorithms and additional  guidance. Performed at [facility], [address], [place], [place] [address]   Brain natriuretic peptide     Status: None  Collection Time: 10/29/22  4:00 PM  Result Value Ref Range   B Natriuretic Peptide 85.6 0.0 - 100.0 pg/mL    Comment: Performed at [facility], [address] [place], [place] [address]  Magnesium     Status: Abnormal   Collection Time: 10/29/22  4:00 PM  Result Value Ref Range   Magnesium 1.6 (L) 1.7 - 2.4 mg/dL    Comment: Performed at [facility], [address] [place], [place] [address]  D-dimer, quantitative     Status: Abnormal   Collection Time: 10/29/22  6:50 PM  Result Value Ref Range   D-Dimer, Quant >20.00 (H) 0.00 - 0.50 ug/mL-FEU    Comment: (NOTE) At the manufacturer cut-off value of 0.5 g/mL FEU, this assay has a negative predictive value of 95-100%.This assay is intended for use in conjunction with a clinical pretest probability (PTP) assessment model to exclude pulmonary embolism (PE) and deep venous thrombosis (DVT) in outpatients suspected of PE or DVT. Results should be correlated with clinical presentation. Performed at [facility], [address], [place], [place] [address]   Troponin I (High Sensitivity)     Status: Abnormal   Collection Time: 10/29/22  6:50 PM  Result Value Ref Range   Troponin I (High Sensitivity) 20 (H) <18 ng/L    Comment: (NOTE) Elevated high sensitivity troponin I (hsTnI) values and significant  changes across serial measurements may suggest ACS  but many other  chronic and acute conditions are known to elevate hsTnI results.  Refer to the "Links" section for chest pain algorithms and additional  guidance. Performed at [facility], [address] [place], [place] [address]    DG Chest 2 View  Result Date: 10/29/2022 CLINICAL DATA:  Palpitation shortness of breath EXAM: CHEST - 2 VIEW COMPARISON:  12/30/2016 FINDINGS: Cardiomegaly. No acute airspace disease, pleural effusion, or pneumothorax. IMPRESSION: Cardiomegaly. Electronically Signed   By: Jasmine Pang M.D.   On: 10/29/2022 17:11    Pending Labs Unresulted Labs (From admission, onward)     Start     Ordered   10/30/22 0500  Basic metabolic panel  Tomorrow morning,   R        10/29/22 2001   10/30/22 0500  Magnesium  Tomorrow morning,   R        10/29/22 2001   10/30/22 0500  Phosphorus  Tomorrow morning,   R        10/29/22 2001   10/30/22 0500  Ferritin  Tomorrow morning,   R        10/29/22 2001   10/30/22 0500  Iron and TIBC  Tomorrow morning,   R        10/29/22 2001   10/30/22 0500  Transferrin  Tomorrow morning,   R        10/29/22 2001   10/30/22 0500  Vitamin B12  Tomorrow morning,   R        10/29/22 2001   10/30/22 0500  Hemoglobin A1c  Tomorrow morning,   R       Comments: To assess prior glycemic control    10/29/22 2024   10/30/22 0500  Lipid panel  Tomorrow morning,   R        10/29/22 2033   10/29/22 2030  Lactic acid, plasma  (Lactic Acid)  ONCE - STAT,   STAT       Comments: Draw after bolus complete    10/29/22  2001   10/29/22 1957  CBC  Once,   R        10/29/22 2001            Vitals/Pain Today's Vitals   10/29/22 1629 10/29/22 1715 10/29/22 1800 10/29/22 2028  BP: 136/63 (!) 123/58 (!) 121/92   Pulse: (!) 57 (!) 54 (!) 58   Resp: 17  18   Temp:   98 F (36.7 C)   TempSrc:   Oral   SpO2: 100% 100% 100%   Weight:      Height:      PainSc: 0-No pain   5     Isolation Precautions No active  isolations  Medications Medications  heparin injection 5,000 Units (has no administration in time range)  sodium chloride flush (NS) 0.9 % injection 3 mL (has no administration in time range)  lactated ringers infusion ( Intravenous New Bag/Given 10/29/22 2042)  acetaminophen (TYLENOL) tablet 1,000 mg (1,000 mg Oral Given 10/29/22 2022)  aspirin EC tablet 81 mg (has no administration in time range)  atorvastatin (LIPITOR) tablet 80 mg (has no administration in time range)  insulin aspart (novoLOG) injection 0-6 Units (has no administration in time range)  insulin aspart (novoLOG) injection 0-5 Units (has no administration in time range)  lactated ringers bolus 500 mL (0 mLs Intravenous Stopped 10/29/22 1700)  lactated ringers bolus 1,000 mL (1,000 mLs Intravenous New Bag/Given 10/29/22 1932)  magnesium sulfate IVPB 2 g 50 mL (0 g Intravenous Stopped 10/29/22 2038)    Mobility walks     Focused Assessments Cardiac Assessment Handoff:    Lab Results  Component Value Date   TROPONINI <0.03 12/30/2016   Lab Results  Component Value Date   DDIMER >20.00 (H) 10/29/2022   Does the Patient currently have chest pain? No    R Recommendations: See Admitting Provider Note  Report given to:   Additional Notes: NS at 150, needs NM scan and venous doppler

## 2022-10-29 NOTE — ED Provider Notes (Signed)
[place] EMERGENCY DEPARTMENT AT [facility] Provider Note   CSN: [id number] Arrival date & time: 10/29/22  1408     History  Chief Complaint  Patient presents with   Chest Pain    Dana Beltran is a 77 y.o. female.  77 year old female with a history of CAD status post PCI, hypertension, hyperlipidemia, diabetes, and CHF who presents to the emergency department with palpitations and shortness of breath.  Yesterday patient reports that she started feeling sick.  Started having diarrhea with 4 episodes of nonbloody nonmelanotic stool yesterday.  Today had several additional loose stools.  No fevers.  No abdominal pain.  Felt nauseous today as if she was going to vomit and then felt that she was having palpitations and significant shortness of breath.  Called 911 and they gave her aspirin and brought her to the emergency department for additional evaluation.  Says that she did not have any additional chest pain it was more so the palpitations that were causing her chest discomfort.  Has been performing intermittent fasting recently to try and lose weight.       Home Medications Prior to Admission medications   Medication Sig Start Date End Date Taking? Authorizing Provider  amLODipine (NORVASC) 10 MG tablet TAKE 1 TABLET BY MOUTH  DAILY 12/26/19   Yates Decamp, MD  ASPIRIN 81 PO Take by mouth.    [provider]  atorvastatin (LIPITOR) 80 MG tablet Take 1 tablet (80 mg total) by mouth daily. 11/14/20   [name], Anabel Bene, MD  Biotin 10 MG TABS 1 tablet    [provider]  Coenzyme Q10 (CO Q-10) 100 MG CAPS 1 capsule with a meal    [provider]  Ginkgo Biloba 40 MG TABS Take by mouth.    [provider]  Glucosamine-Chondroit-Vit C-Mn (GLUCOSAMINE 1500 COMPLEX PO) Take by mouth.    [provider]  metFORMIN (GLUCOPHAGE-XR) 500 MG 24 hr tablet 1 tablet with meals    [provider]  metoprolol succinate (TOPROL-XL) 50 MG  24 hr tablet TAKE 1 TABLET BY MOUTH  DAILY 08/20/20   Yates Decamp, MD  Multiple Vitamins-Minerals (CENTRUM SILVER PO) Take by mouth.    [provider]  valsartan-hydrochlorothiazide (DIOVAN-HCT) 320-12.5 MG tablet TAKE 1 TABLET BY MOUTH  DAILY 03/16/20   Yates Decamp, MD  Vitamin A 3 MG (10000 UT) TABS 1 tablet    [provider]      Allergies    Chocolate and Citrus    Review of Systems   Review of Systems  Physical Exam Updated Vital Signs BP (!) 123/58   Pulse (!) 50   Temp 97.9 F (36.6 C) (Oral)   Resp (!) 28   Ht 5\' 2"  (1.575 m)   Wt 68.9 kg   SpO2 100%   BMI 27.80 kg/m  Physical Exam Vitals and nursing note reviewed.  Constitutional:      General: She is not in acute distress.    Appearance: She is well-developed.  HENT:     Head: Normocephalic and atraumatic.     Right Ear: External ear normal.     Left Ear: External ear normal.     Nose: Nose normal.  Eyes:     Extraocular Movements: Extraocular movements intact.     Conjunctiva/sclera: Conjunctivae normal.     Pupils: Pupils are equal, round, and reactive to light.  Cardiovascular:     Rate and Rhythm: Regular rhythm. Bradycardia present.  Heart sounds: No murmur heard. Pulmonary:     Effort: Pulmonary effort is normal. No respiratory distress.     Breath sounds: Normal breath sounds.  Abdominal:     General: Abdomen is flat. There is no distension.     Palpations: Abdomen is soft. There is no mass.     Tenderness: There is no abdominal tenderness. There is no guarding.  Musculoskeletal:     Cervical back: Normal range of motion and neck supple.     Right lower leg: No edema.     Left lower leg: No edema.  Skin:    General: Skin is warm and dry.  Neurological:     Mental Status: She is alert and oriented to person, place, and time. Mental status is at baseline.  Psychiatric:        Mood and Affect: Mood normal.     ED Results / Procedures / Treatments   Labs (all labs ordered  are listed, but only abnormal results are displayed) Labs Reviewed  CBC - Abnormal; Notable for the following components:      Result Value   RBC 3.59 (*)    Hemoglobin 10.0 (*)    HCT 29.6 (*)    RDW 15.7 (*)    All other components within normal limits  RESP PANEL BY RT-PCR (RSV, FLU A&B, COVID)  [id number]  BRAIN NATRIURETIC PEPTIDE  BASIC METABOLIC PANEL  MAGNESIUM  D-DIMER, QUANTITATIVE (NOT AT [facility])  TROPONIN I (HIGH SENSITIVITY)  TROPONIN I (HIGH SENSITIVITY)    EKG EKG Interpretation Date/Time:  Wednesday October 29 2022 14:30:25 EDT Ventricular Rate:  49 PR Interval:  188 QRS Duration:  94 QT Interval:  470 QTC Calculation: 425 R Axis:   22  Text Interpretation: Sinus bradycardia Probable LVH with secondary repol abnrm Confirmed by Vonita Moss [phone]) on 10/29/2022 2:53:03 PM  Radiology No results found.  Procedures Procedures    Medications Ordered in ED Medications  lactated ringers bolus 500 mL (500 mLs Intravenous New Bag/Given 10/29/22 1604)    ED Course/ Medical Decision Making/ A&P Clinical Course as of 10/29/22 1651  Wed Oct 29, 2022  1533 Signed out to Dr Theresia Lo [RP]    Clinical Course User Index [RP] Rondel Baton, MD                                 Medical Decision Making Amount and/or Complexity of Data Reviewed Labs: ordered. Radiology: ordered.   Dana Beltran is a 77 y.o. female with comorbidities that complicate the patient evaluation including CAD status post PCI, hypertension, hyperlipidemia, diabetes, and CHF who presents to the emergency department with palpitations and shortness of breath.     Initial Ddx:  Arrhythmia, electrolyte abnormality, dehydration, GI bleed, MI, PE  MDM/Course:  Patient presents to the emergency department with a day of diarrhea and acute onset palpitations and shortness of breath.  Initially was describing some chest discomfort to EMS and appears that they gave her aspirin and route.  Denies  any chest pain on my examination and appeared to be more of a presyncopal episode of the palpitations and shortness of breath rather than ischemic sounding chest pain.  However, given her risk factors will obtain EKG and troponins at this time.  With her shortness of breath we will also obtain a D-dimer to evaluate for PE.  Does report a cough as well as sore throat so COVID and flu  were ordered as well as a chest x-ray.  Is having diarrhea but no melena or hematochezia.  Was given some IV fluids in case she is dehydrated.  EKG did not show any signs of arrhythmia but did show some new T wave inversions in the lateral and inferior leads which could be due to LVH.  Upon re-evaluation patient remained stable.  Signed out to the oncoming physician awaiting lab work and imaging.  This patient presents to the ED for concern of complaints listed in HPI, this involves an extensive number of treatment options, and is a complaint that carries with it a high risk of complications and morbidity. Disposition including potential need for admission considered.   Dispo: Pending remainder of workup  Additional history obtained from EMS Records reviewed Outpatient Clinic Notes I personally reviewed and interpreted cardiac monitoring: sinus bradycardia I personally reviewed and interpreted the pt's EKG: see above for interpretation  I have reviewed the patients home medications and made adjustments as needed Social Determinants of health:  Elderly         Final Clinical Impression(s) / ED Diagnoses Final diagnoses:  Dizziness  Palpitations  Shortness of breath    Rx / DC Orders ED Discharge Orders     None         Rondel Baton, MD 10/29/22 1652

## 2022-10-30 ENCOUNTER — Observation Stay (HOSPITAL_COMMUNITY): Payer: Medicare HMO

## 2022-10-30 DIAGNOSIS — Z1152 Encounter for screening for COVID-19: Secondary | ICD-10-CM | POA: Diagnosis not present

## 2022-10-30 DIAGNOSIS — I428 Other cardiomyopathies: Secondary | ICD-10-CM

## 2022-10-30 DIAGNOSIS — N179 Acute kidney failure, unspecified: Secondary | ICD-10-CM | POA: Diagnosis not present

## 2022-10-30 DIAGNOSIS — I5A Non-ischemic myocardial injury (non-traumatic): Secondary | ICD-10-CM | POA: Diagnosis not present

## 2022-10-30 DIAGNOSIS — R791 Abnormal coagulation profile: Secondary | ICD-10-CM | POA: Diagnosis not present

## 2022-10-30 LAB — LIPID PANEL
Cholesterol: 170 mg/dL (ref 0–200)
HDL: 38 mg/dL — ABNORMAL LOW (ref >40)
LDL Cholesterol: 114 mg/dL — ABNORMAL HIGH (ref 0–99)
Total CHOL/HDL Ratio: 4.5 RATIO
Triglycerides: 88 mg/dL (ref <150)
VLDL: 18 mg/dL (ref 0–40)

## 2022-10-30 LAB — IRON AND TIBC
Iron: 54 ug/dL (ref 28–170)
Saturation Ratios: 26 % (ref 10.4–31.8)
TIBC: 207 ug/dL — ABNORMAL LOW (ref 250–450)
UIBC: 153 ug/dL

## 2022-10-30 LAB — ECHOCARDIOGRAM COMPLETE
AR max vel: 2.13 cm2
AV Area VTI: 2.05 cm2
AV Area mean vel: 2.08 cm2
AV Mean grad: 6 mmHg
AV Peak grad: 9.5 mmHg
Ao pk vel: 1.54 m/s
Area-P 1/2: 2.09 cm2
Height: 62 in
S' Lateral: 1.4 cm
Weight: 2432 oz

## 2022-10-30 LAB — GLUCOSE, CAPILLARY
Glucose-Capillary: 80 mg/dL (ref 70–99)
Glucose-Capillary: 84 mg/dL (ref 70–99)

## 2022-10-30 LAB — FERRITIN: Ferritin: 383 ng/mL — ABNORMAL HIGH (ref 11–307)

## 2022-10-30 LAB — BASIC METABOLIC PANEL
Anion gap: 13 (ref 5–15)
BUN: 29 mg/dL — ABNORMAL HIGH (ref 8–23)
CO2: 20 mmol/L — ABNORMAL LOW (ref 22–32)
Calcium: 9.1 mg/dL (ref 8.9–10.3)
Chloride: 99 mmol/L (ref 98–111)
Creatinine, Ser: 1.65 mg/dL — ABNORMAL HIGH (ref 0.44–1.00)
GFR, Estimated: 32 mL/min — ABNORMAL LOW (ref >60)
Glucose, Bld: 73 mg/dL (ref 70–99)
Potassium: 3.3 mmol/L — ABNORMAL LOW (ref 3.5–5.1)
Sodium: 132 mmol/L — ABNORMAL LOW (ref 135–145)

## 2022-10-30 LAB — HEMOGLOBIN A1C
Hgb A1c MFr Bld: 5.5 % (ref 4.8–5.6)
Mean Plasma Glucose: 111.15 mg/dL

## 2022-10-30 LAB — VITAMIN B12: Vitamin B-12: 792 pg/mL (ref 180–914)

## 2022-10-30 LAB — MAGNESIUM: Magnesium: 1.8 mg/dL (ref 1.7–2.4)

## 2022-10-30 LAB — TRANSFERRIN: Transferrin: 146 mg/dL — ABNORMAL LOW (ref 192–382)

## 2022-10-30 LAB — PHOSPHORUS: Phosphorus: 2.9 mg/dL (ref 2.5–4.6)

## 2022-10-30 MED ORDER — HYDRALAZINE HCL 20 MG/ML IJ SOLN: 10.0000 mg | INTRAMUSCULAR | Status: DC | PRN

## 2022-10-30 MED ORDER — ONDANSETRON HCL 4 MG/2ML IJ SOLN: 4.0000 mg | Freq: Four times a day (QID) | INTRAMUSCULAR | Status: DC | PRN

## 2022-10-30 MED ORDER — TECHNETIUM TO 99M ALBUMIN AGGREGATED
4.4000 | Freq: Once | INTRAVENOUS | Status: AC | PRN
  Administered 2022-10-30: 4.4 via INTRAVENOUS

## 2022-10-30 MED ORDER — POTASSIUM CHLORIDE CRYS ER 20 MEQ PO TBCR: 40.0000 meq | EXTENDED_RELEASE_TABLET | Freq: Once | ORAL | Status: DC

## 2022-10-30 MED ORDER — ACETAMINOPHEN 325 MG PO TABS: 650.0000 mg | ORAL_TABLET | Freq: Four times a day (QID) | ORAL | Status: DC | PRN

## 2022-10-30 MED ORDER — METOPROLOL TARTRATE 5 MG/5ML IV SOLN: 5.0000 mg | INTRAVENOUS | Status: DC | PRN

## 2022-10-30 MED ORDER — GUAIFENESIN 100 MG/5ML PO LIQD: 5.0000 mL | ORAL | Status: DC | PRN

## 2022-10-30 MED ORDER — TRAZODONE HCL 50 MG PO TABS: 50.0000 mg | ORAL_TABLET | Freq: Every evening | ORAL | Status: DC | PRN

## 2022-10-30 MED ORDER — IPRATROPIUM-ALBUTEROL 0.5-2.5 (3) MG/3ML IN SOLN: 3.0000 mL | RESPIRATORY_TRACT | Status: DC | PRN

## 2022-10-30 MED ORDER — SENNOSIDES-DOCUSATE SODIUM 8.6-50 MG PO TABS: 1.0000 | ORAL_TABLET | Freq: Every evening | ORAL | Status: DC | PRN

## 2022-10-30 MED ORDER — LACTATED RINGERS IV SOLN: INTRAVENOUS | Status: DC

## 2022-10-30 NOTE — Progress Notes (Signed)
PROGRESS NOTE    Dana Beltran  WUJ:[id number] DOB: [birth year] DOA: 10/29/2022 PCP: Margot Ables, MD (Inactive)     Brief Narrative:  77 year old with history of HTN, HLD, DM 2, CAD/MI presents with episode of chest pain and presyncope.  Patient has had 2 days of watery diarrhea along with nausea.  Started having presyncopal episode earlier therefore admitted to the hospital.  Patient also noted to have AKI.  Renal function improved with IV fluids, VQ scan low probability for PE.     Assessment & Plan:  Principal Problem:   Acute kidney injury (HCC) Active Problems:   Myocardial injury   Elevated d-dimer   Hypovolemia    Chest pain with presyncope Elevated D-dimer Chest pain most likely noncardiac in nature.  Troponins remain flat.  EKG shows T wave inversion in lateral leads, will repeat again this morning. Due to elevated D-dimer, VQ scan showing low probability for PE Lower extremity Dopplers A1c 5.5, LDL 114 Echocardiogram   AKI secondary to dehydration Baseline creatinine 1.0, admission creatinine 2.1.  Downtrending with IV fluids, today's 1.65.  Will need repeat lab work   Diarrhea, acute onset  Resolved per patient   Hypovol hyponatremia IV fluids   Anemia, normocytic  Hemoglobin around baseline of 10.0   Hypomagnesemia, asymptomatic  Replete as needed   CAD status post PCI Hyperlipidemia Continue home aspirin, Atorvastatin    HTN Home meds on hold.  Slowly resume as appropriate   DM 2 Will resume metformin upon discharge     DVT prophylaxis: heparin injection 5,000 Units Start: 10/29/22 2200 Code Status: Full code Family Communication:   Pending echocardiogram and lower extremity Dopplers.  If resulted in time, will be able to discharge her later today           Subjective:  Feeling better, wants to go home.  Denies any diarrhea  Examination:  General exam: Appears calm and comfortable  Respiratory system: Clear to  auscultation. Respiratory effort normal. Cardiovascular system: S1 & S2 heard, RRR. No JVD, murmurs, rubs, gallops or clicks. No pedal edema. Gastrointestinal system: Abdomen is nondistended, soft and nontender. No organomegaly or masses felt. Normal bowel sounds heard. Central nervous system: Alert and oriented. No focal neurological deficits. Extremities: Symmetric 5 x 5 power. Skin: No rashes, lesions or ulcers Psychiatry: Judgement and insight appear normal. Mood & affect appropriate.       Diet Orders (From admission, onward)     Start     Ordered   10/29/22 1956  Diet heart healthy/carb modified Room service appropriate? Yes; Fluid consistency: Thin  Diet effective now       Question Answer Comment  Diet-HS Snack? Nothing   Room service appropriate? Yes   Fluid consistency: Thin      10/29/22 2001            Objective: Vitals:   10/29/22 1800 10/29/22 2200 10/30/22 0450 10/30/22 0748  BP: (!) 121/92 (!) 141/68 139/64 139/63  Pulse: (!) 58 (!) 59 (!) 55 (!) 53  Resp: 18 18 18 17   Temp: 98 F (36.7 C) 98.3 F (36.8 C) 98.6 F (37 C) 98.6 F (37 C)  TempSrc: Oral Oral Oral Oral  SpO2: 100% 100% 100% 100%  Weight:      Height:        Intake/Output Summary (Last 24 hours) at 10/30/2022 1218 Last data filed at 10/29/2022 2042 Gross per 24 hour  Intake 1550 ml  Output --  Net 1550 ml  Filed Weights   10/29/22 1432  Weight: 68.9 kg    Scheduled Meds:  aspirin EC  81 mg Oral Daily   atorvastatin  80 mg Oral Daily   heparin  5,000 Units Subcutaneous Q8H   insulin aspart  0-5 Units Subcutaneous QHS   insulin aspart  0-6 Units Subcutaneous TID WC   potassium chloride  40 mEq Oral Once   sodium chloride flush  3 mL Intravenous Q12H   Continuous Infusions:  Nutritional status     Body mass index is 27.8 kg/m.  Data Reviewed:   CBC: Recent Labs  Lab 10/29/22 1600 10/29/22 2100  WBC 4.6 4.7  HGB 10.0* 9.3*  HCT 29.6* 27.9*  MCV 82.5 81.6  PLT  201 132*   Basic Metabolic Panel: Recent Labs  Lab 10/29/22 1600 10/30/22 0741  NA 130* 132*  K 4.2 3.3*  CL 94* 99  CO2 19* 20*  GLUCOSE 86 73  BUN 37* 29*  CREATININE 2.19* 1.65*  CALCIUM 9.3 9.1  MG 1.6* 1.8  PHOS  --  2.9   GFR: Estimated Creatinine Clearance: 26.8 mL/min (A) (by C-G formula based on SCr of 1.65 mg/dL (H)). Liver Function Tests: No results for input(s): "AST", "ALT", "ALKPHOS", "BILITOT", "PROT", "ALBUMIN" in the last 168 hours. No results for input(s): "LIPASE", "AMYLASE" in the last 168 hours. No results for input(s): "AMMONIA" in the last 168 hours. Coagulation Profile: No results for input(s): "INR", "PROTIME" in the last 168 hours. Cardiac Enzymes: No results for input(s): "CKTOTAL", "CKMB", "CKMBINDEX", "TROPONINI" in the last 168 hours. BNP (last 3 results) No results for input(s): "PROBNP" in the last 8760 hours. HbA1C: Recent Labs    10/30/22 0741  HGBA1C 5.5   CBG: Recent Labs  Lab 10/29/22 2200 10/30/22 0747  GLUCAP 68* 80   Lipid Profile: Recent Labs    10/30/22 0741  CHOL 170  HDL 38*  LDLCALC 114*  TRIG 88  CHOLHDL 4.5   Thyroid Function Tests: No results for input(s): "TSH", "T4TOTAL", "FREET4", "T3FREE", "THYROIDAB" in the last 72 hours. Anemia Panel: Recent Labs    10/30/22 0741  VITAMINB12 792  FERRITIN 383*  TIBC 207*  IRON 54   Sepsis Labs: Recent Labs  Lab 10/29/22 2100  LATICACIDVEN 2.0*    Recent Results (from the past 240 hour(s))  Resp panel by RT-PCR (RSV, Flu A&B, Covid) Anterior Nasal Swab     Status: None   Collection Time: 10/29/22  3:28 PM   Specimen: Anterior Nasal Swab  Result Value Ref Range Status   SARS Coronavirus 2 by RT PCR NEGATIVE NEGATIVE Final   Influenza A by PCR NEGATIVE NEGATIVE Final   Influenza B by PCR NEGATIVE NEGATIVE Final    Comment: (NOTE) The Xpert Xpress SARS-CoV-2/FLU/RSV plus assay is intended as an aid in the diagnosis of influenza from Nasopharyngeal swab  specimens and should not be used as a sole basis for treatment. Nasal washings and aspirates are unacceptable for Xpert Xpress SARS-CoV-2/FLU/RSV testing.  Fact Sheet for Patients: BloggerCourse.com  Fact Sheet for Healthcare Providers: SeriousBroker.it  This test is not yet approved or cleared by the [place] FDA and has been authorized for detection and/or diagnosis of SARS-CoV-2 by FDA under an Emergency Use Authorization (EUA). This EUA will remain in effect (meaning this test can be used) for the duration of the COVID-19 declaration under Section 564(b)(1) of the Act, 21 U.S.C. section 360bbb-3(b)(1), unless the authorization is terminated or revoked.     Resp  Syncytial Virus by PCR NEGATIVE NEGATIVE Final    Comment: (NOTE) Fact Sheet for Patients: BloggerCourse.com  Fact Sheet for Healthcare Providers: SeriousBroker.it  This test is not yet approved or cleared by the [place] FDA and has been authorized for detection and/or diagnosis of SARS-CoV-2 by FDA under an Emergency Use Authorization (EUA). This EUA will remain in effect (meaning this test can be used) for the duration of the COVID-19 declaration under Section 564(b)(1) of the Act, 21 U.S.C. section 360bbb-3(b)(1), unless the authorization is terminated or revoked.  Performed at [facility], [address], [place], [place] [address]          Radiology Studies: NM Pulmonary Perfusion  Result Date: 10/30/2022 CLINICAL DATA:  Elevated D-dimer. Chest pain. Pre syncopal episode. Evaluate for pulmonary embolism. EXAM: NUCLEAR MEDICINE PERFUSION LUNG SCAN TECHNIQUE: Perfusion images were obtained in multiple projections after intravenous injection of radiopharmaceutical. Ventilation scans intentionally deferred if perfusion scan and chest x-ray adequate for interpretation during COVID 19 epidemic.  RADIOPHARMACEUTICALS:  4.4 mCi Tc-63m MAA IV COMPARISON:  Chest radiograph-10/29/2022; 12/31/2026 FINDINGS: Review of chest radiograph performed 10/29/2022 demonstrates unchanged enlarged cardiac silhouette and mediastinal contours. Post coronary stent placement. No focal airspace opacities. No pleural effusion or pneumothorax. No evidence of edema. Stigmata of dish within the thoracic spine. There is homogeneous distribution of injected radiotracer throughout the pulmonary parenchyma with relative area oligemia within the left lower lung attributable to cardiomegaly. No discrete areas of segmental or subsegmental non perfusion to suggest pulmonary embolism. IMPRESSION: Pulmonary embolism absent (low probability of pulmonary embolism. If clinical concern persists, further evaluation with chest CTA and/or bilateral lower extremity venous Doppler ultrasound could be performed as indicated Electronically Signed   By: Simonne Come M.D.   On: 10/30/2022 11:01   DG Chest 2 View  Result Date: 10/29/2022 CLINICAL DATA:  Palpitation shortness of breath EXAM: CHEST - 2 VIEW COMPARISON:  12/30/2016 FINDINGS: Cardiomegaly. No acute airspace disease, pleural effusion, or pneumothorax. IMPRESSION: Cardiomegaly. Electronically Signed   By: Jasmine Pang M.D.   On: 10/29/2022 17:11           LOS: 0 days   Time spent= 35 mins    Miguel Rota, MD [facility]  If 7PM-7AM, please contact night-coverage  10/30/2022, 12:18 PM

## 2022-10-30 NOTE — Hospital Course (Addendum)
  Brief Narrative:  77 year old with history of HTN, HLD, DM 2, CAD/MI presents with episode of chest pain and presyncope.  Patient has had 2 days of watery diarrhea along with nausea.  Started having presyncopal episode earlier therefore admitted to the hospital.  Patient also noted to have AKI.  Renal function improved with IV fluids, VQ scan low probability for PE.  Eventually lower extremity Dopplers and echocardiogram were unremarkable.  Patient was adamant to go home as she was feeling much better.  Medically stable for discharge with outpatient follow-up.     Assessment & Plan:  Principal Problem:   Acute kidney injury (HCC) Active Problems:   Myocardial injury   Elevated d-dimer   Hypovolemia    Chest pain with presyncope Elevated D-dimer Chest pain most likely noncardiac in nature.  Troponins remain flat.  EKG shows T wave inversion in lateral leads, will repeat again this morning. Due to elevated D-dimer, VQ scan showing low probability for PE Lower extremity Dopplers A1c 5.5, LDL 114 Echocardiogram   AKI secondary to dehydration Baseline creatinine 1.0, admission creatinine 2.1.  Downtrending with IV fluids, today's 1.65.  Will need repeat lab work with PCP   Diarrhea, acute onset  Resolved per patient   Hypovol hyponatremia IV fluids   Anemia, normocytic  Hemoglobin around baseline of 10.0   Hypomagnesemia, asymptomatic  Replete as needed   CAD status post PCI Hyperlipidemia Continue home aspirin, Atorvastatin    HTN Home meds on hold.  Slowly resume as appropriate   DM 2 Will resume metformin upon discharge     DVT prophylaxis: heparin injection 5,000 Units Start: 10/29/22 2200 Code Status: Full code Family Communication:   Pending echocardiogram and lower extremity Dopplers.  If resulted in time, will be able to discharge her later today

## 2022-10-30 NOTE — Progress Notes (Signed)
Transition of Care [facility]) - Inpatient Brief Assessment   Patient Details  Name: Dana Beltran MRN: [id number] Date of Birth: [birth year]  [facility]) CM/SW Contact:    Janae Bridgeman, RN Phone Number: 10/30/2022, 4:11 PM   Clinical Narrative: Cm met with the patient at the bedside.  Patient admitted for chest pain and pre-syncopal episode.  Medicare Observation notice provided to the patient.  Patient plans to call family to provide transportation to home via car.   Transition of Care Asessment: Insurance and Status: (P) Insurance coverage has been reviewed Patient has primary care physician: (P) Yes Home environment has been reviewed: (P) from home alone Prior level of function:: (P) Independent Prior/Current Home Services: (P) No current home services Social Determinants of Health Reivew: (P) SDOH reviewed no interventions necessary Readmission risk has been reviewed: (P) Yes Transition of care needs: (P) no transition of care needs at this time

## 2022-10-30 NOTE — Care Management Obs Status (Cosign Needed)
MEDICARE OBSERVATION STATUS NOTIFICATION   Patient Details  Name: Dana Beltran MRN: [id number] Date of Birth: October 24, 1945   Medicare Observation Status Notification Given:  Yes    Janae Bridgeman, RN 10/30/2022, 3:57 PM

## 2022-10-30 NOTE — Plan of Care (Signed)

## 2022-10-31 NOTE — Discharge Summary (Signed)
Physician Discharge Summary  Dana Beltran ZOX:[id number] DOB: [birth year] DOA: 10/29/2022  PCP: Margot Ables, MD (Inactive)  Admit date: 10/29/2022 Discharge date: 10/30/2022  Admitted From: Home Disposition: Home  Recommendations for Outpatient Follow-up:  Follow up with PCP in 1-2 weeks Please obtain BMP/CBC in one week your next doctors visit.     Discharge Condition: Stable CODE STATUS: Full code Diet recommendation: Diabetic  Brief/Interim Summary:  Brief Narrative:  77 year old with history of HTN, HLD, DM 2, CAD/MI presents with episode of chest pain and presyncope.  Patient has had 2 days of watery diarrhea along with nausea.  Started having presyncopal episode earlier therefore admitted to the hospital.  Patient also noted to have AKI.  Renal function improved with IV fluids, VQ scan low probability for PE.  Eventually lower extremity Dopplers and echocardiogram were unremarkable.  Patient was adamant to go home as she was feeling much better.  Medically stable for discharge with outpatient follow-up.     Assessment & Plan:  Principal Problem:   Acute kidney injury (HCC) Active Problems:   Myocardial injury   Elevated d-dimer   Hypovolemia    Chest pain with presyncope Elevated D-dimer Chest pain most likely noncardiac in nature.  Troponins remain flat.  EKG shows T wave inversion in lateral leads, will repeat again this morning. Due to elevated D-dimer, VQ scan showing low probability for PE Lower extremity Dopplers A1c 5.5, LDL 114 Echocardiogram   AKI secondary to dehydration Baseline creatinine 1.0, admission creatinine 2.1.  Downtrending with IV fluids, today's 1.65.  Will need repeat lab work with PCP   Diarrhea, acute onset  Resolved per patient   Hypovol hyponatremia IV fluids   Anemia, normocytic  Hemoglobin around baseline of 10.0   Hypomagnesemia, asymptomatic  Replete as needed   CAD status post PCI Hyperlipidemia Continue home  aspirin, Atorvastatin    HTN Home meds on hold.  Slowly resume as appropriate   DM 2 Will resume metformin upon discharge     DVT prophylaxis: heparin injection 5,000 Units Start: 10/29/22 2200 Code Status: Full code Family Communication:   Pending echocardiogram and lower extremity Dopplers.  If resulted in time, will be able to discharge her later today   Discharge Diagnoses:  Principal Problem:   Acute kidney injury [facility]) Active Problems:   Myocardial injury   Elevated d-dimer   Hypovolemia     Subjective:  Much better, adamant about going home.  Discharge Exam: Vitals:   10/30/22 0748 10/30/22 1556  BP: 139/63 137/60  Pulse: (!) 53 (!) 51  Resp: 17   Temp: 98.6 F (37 C) (!) 97.5 F (36.4 C)  SpO2: 100% 100%   Vitals:   10/29/22 2200 10/30/22 0450 10/30/22 0748 10/30/22 1556  BP: (!) 141/68 139/64 139/63 137/60  Pulse: (!) 59 (!) 55 (!) 53 (!) 51  Resp: 18 18 17    Temp: 98.3 F (36.8 C) 98.6 F (37 C) 98.6 F (37 C) (!) 97.5 F (36.4 C)  TempSrc: Oral Oral Oral Oral  SpO2: 100% 100% 100% 100%  Weight:      Height:        General: Pt is alert, awake, not in acute distress Cardiovascular: RRR, S1/S2 +, no rubs, no gallops Respiratory: CTA bilaterally, no wheezing, no rhonchi Abdominal: Soft, NT, ND, bowel sounds + Extremities: no edema, no cyanosis  Discharge Instructions   Allergies as of 10/30/2022       Reactions   Caffeine Rash   Coconut (cocos Nucifera)  Rash   Orange Oil Rash   Pineapple Rash   Prunus Persica Rash   Strawberry Extract Rash   Tomato Rash   Chocolate Rash   Citrus Rash   Peach Flavor Rash        Medication List     TAKE these medications    amLODipine 10 MG tablet Commonly known as: NORVASC TAKE 1 TABLET BY MOUTH  DAILY   ASPIRIN 81 PO Take by mouth.   atorvastatin 80 MG tablet Commonly known as: LIPITOR Take 1 tablet (80 mg total) by mouth daily.   Biotin 10 MG Tabs 1 tablet   CENTRUM SILVER  PO Take by mouth.   Co Q-10 100 MG Caps 1 capsule with a meal   Ginkgo Biloba 40 MG Tabs Take by mouth.   GLUCOSAMINE 1500 COMPLEX PO Take by mouth.   metFORMIN 500 MG 24 hr tablet Commonly known as: GLUCOPHAGE-XR 1 tablet with meals   metoprolol succinate 50 MG 24 hr tablet Commonly known as: TOPROL-XL TAKE 1 TABLET BY MOUTH  DAILY   valsartan-hydrochlorothiazide 320-12.5 MG tablet Commonly known as: DIOVAN-HCT TAKE 1 TABLET BY MOUTH  DAILY   Vitamin A 3 MG (10000 UT) Tabs 1 tablet        Follow-up Information     Margot Ables, MD Follow up in 1 week(s).   Specialty: Family Medicine Contact information: [address] [place] [address] [phone]                Allergies  Allergen Reactions   Caffeine Rash   Coconut (Cocos Nucifera) Rash   Orange Oil Rash   Pineapple Rash   Prunus Persica Rash   Strawberry Extract Rash   Tomato Rash   Chocolate Rash   Citrus Rash   Peach Flavor Rash    You were cared for by a hospitalist during your hospital stay. If you have any questions about your discharge medications or the care you received while you were in the hospital after you are discharged, you can call the unit and asked to speak with the hospitalist on call if the hospitalist that took care of you is not available. Once you are discharged, your primary care physician will handle any further medical issues. Please note that no refills for any discharge medications will be authorized once you are discharged, as it is imperative that you return to your primary care physician (or establish a relationship with a primary care physician if you do not have one) for your aftercare needs so that they can reassess your need for medications and monitor your lab values.  You were cared for by a hospitalist during your hospital stay. If you have any questions about your discharge medications or the care you received while you were in the hospital  after you are discharged, you can call the unit and asked to speak with the hospitalist on call if the hospitalist that took care of you is not available. Once you are discharged, your primary care physician will handle any further medical issues. Please note that NO REFILLS for any discharge medications will be authorized once you are discharged, as it is imperative that you return to your primary care physician (or establish a relationship with a primary care physician if you do not have one) for your aftercare needs so that they can reassess your need for medications and monitor your lab values.  Please request your Prim.MD to go over all Hospital Tests and Procedure/Radiological results at  the follow up, please get all Hospital records sent to your Prim MD by signing hospital release before you go home.  Get CBC, CMP, 2 view Chest X ray checked  by Primary MD during your next visit or SNF MD in 5-7 days ( we routinely change or add medications that can affect your baseline labs and fluid status, therefore we recommend that you get the mentioned basic workup next visit with your PCP, your PCP may decide not to get them or add new tests based on their clinical decision)  On your next visit with your primary care physician please Get Medicines reviewed and adjusted.  If you experience worsening of your admission symptoms, develop shortness of breath, life threatening emergency, suicidal or homicidal thoughts you must seek medical attention immediately by calling 911 or calling your MD immediately  if symptoms less severe.  You Must read complete instructions/literature along with all the possible adverse reactions/side effects for all the Medicines you take and that have been prescribed to you. Take any new Medicines after you have completely understood and accpet all the possible adverse reactions/side effects.   Do not drive, operate heavy machinery, perform activities at heights, swimming or  participation in water activities or provide baby sitting services if your were admitted for syncope or siezures until you have seen by Primary MD or a Neurologist and advised to do so again.  Do not drive when taking Pain medications.   Procedures/Studies: VAS [place] LOWER EXTREMITY VENOUS (DVT)  Result Date: 10/30/2022  Lower Venous DVT Study Patient Name:  Dana Beltran  Date of Exam:   10/30/2022 Medical Rec #: [id number]   Accession #:    [id number] Date of Birth: February 09, 1945   Patient Gender: F Patient Age:   34 years Exam Location:  [facility] Procedure:      VAS [place] LOWER EXTREMITY VENOUS (DVT) Referring Phys: Dolly Rias --------------------------------------------------------------------------------  Indications: Edema, and Elevated D-Dimer.  Comparison Study: No prior study Performing Technologist: Sherren Kerns RVS  Examination Guidelines: A complete evaluation includes B-mode imaging, spectral Doppler, color Doppler, and power Doppler as needed of all accessible portions of each vessel. Bilateral testing is considered an integral part of a complete examination. Limited examinations for reoccurring indications may be performed as noted. The reflux portion of the exam is performed with the patient in reverse Trendelenburg.  +---------+---------------+---------+-----------+----------+--------------+ RIGHT    CompressibilityPhasicitySpontaneityPropertiesThrombus Aging +---------+---------------+---------+-----------+----------+--------------+ CFV      Full           Yes      Yes                                 +---------+---------------+---------+-----------+----------+--------------+ SFJ      Full                                                        +---------+---------------+---------+-----------+----------+--------------+ FV Prox  Full                                                        +---------+---------------+---------+-----------+----------+--------------+  FV Mid   Full                                                        +---------+---------------+---------+-----------+----------+--------------+  FV DistalFull                                                        +---------+---------------+---------+-----------+----------+--------------+ PFV      Full                                                        +---------+---------------+---------+-----------+----------+--------------+ POP      Full           Yes      Yes                                 +---------+---------------+---------+-----------+----------+--------------+ PTV      Full                                                        +---------+---------------+---------+-----------+----------+--------------+ PERO     Full                                                        +---------+---------------+---------+-----------+----------+--------------+   +---------+---------------+---------+-----------+----------+--------------+ LEFT     CompressibilityPhasicitySpontaneityPropertiesThrombus Aging +---------+---------------+---------+-----------+----------+--------------+ CFV      Full           Yes      Yes                                 +---------+---------------+---------+-----------+----------+--------------+ SFJ      Full                                                        +---------+---------------+---------+-----------+----------+--------------+ FV Prox  Full                                                        +---------+---------------+---------+-----------+----------+--------------+ FV Mid   Full                                                        +---------+---------------+---------+-----------+----------+--------------+ FV DistalFull                                                        +---------+---------------+---------+-----------+----------+--------------+  PFV      Full                                                         +---------+---------------+---------+-----------+----------+--------------+ POP      Full           Yes      Yes                                 +---------+---------------+---------+-----------+----------+--------------+ PTV      Full                                                        +---------+---------------+---------+-----------+----------+--------------+ PERO     Full                                                        +---------+---------------+---------+-----------+----------+--------------+     Summary: BILATERAL: - No evidence of deep vein thrombosis seen in the lower extremities, bilaterally. -No evidence of popliteal cyst, bilaterally.   *See table(s) above for measurements and observations. Electronically signed by Heath Lark on 10/30/2022 at 5:10:39 PM.    Final    ECHOCARDIOGRAM COMPLETE  Result Date: 10/30/2022    ECHOCARDIOGRAM REPORT   Patient Name:   Dana Beltran Date of Exam: 10/30/2022 Medical Rec #:  [id number]  Height:       62.0 in Accession #:    [id number] Weight:       152.0 lb Date of Birth:  [birth year]  BSA:          1.701 m Patient Age:    77 years   BP:           139/63 mmHg Patient Gender: F          HR:           50 bpm. Exam Location:  Inpatient Procedure: 2D Echo, Cardiac Doppler and Color Doppler Indications:    Cardiomyopathy  History:        Patient has no prior history of Echocardiogram examinations.  Sonographer:    Darlys Gales Referring Phys: [id number] Doreen Salvage Azhar Yogi IMPRESSIONS  1. Left ventricular ejection fraction, by estimation, is 65 to 70%. The left ventricle has normal function. The left ventricle has no regional wall motion abnormalities. There is severe concentric left ventricular hypertrophy. Left ventricular diastolic  parameters were normal.  2. Right ventricular systolic function is normal. The right ventricular size is normal. There is mildly elevated pulmonary artery systolic pressure.  3. Left atrial size  was mildly dilated.  4. Right atrial size was mildly dilated.  5. Moderate pericardial effusion. The pericardial effusion is circumferential. There is no evidence of cardiac tamponade.  6. The mitral valve is normal in structure. Trivial mitral valve regurgitation. No evidence of mitral stenosis.  7. Tricuspid valve regurgitation is moderate.  8. There is focal calcification on the noncoronary cusp. The aortic valve is tricuspid.  Aortic valve regurgitation is not visualized. Aortic valve sclerosis/calcification is present, without any evidence of aortic stenosis.  9. The inferior vena cava is dilated in size with >50% respiratory variability, suggesting right atrial pressure of 8 mmHg. FINDINGS  Left Ventricle: Left ventricular ejection fraction, by estimation, is 65 to 70%. The left ventricle has normal function. The left ventricle has no regional wall motion abnormalities. The left ventricular internal cavity size was small. There is severe concentric left ventricular hypertrophy. Left ventricular diastolic parameters were normal. Right Ventricle: The right ventricular size is normal. No increase in right ventricular wall thickness. Right ventricular systolic function is normal. There is mildly elevated pulmonary artery systolic pressure. The tricuspid regurgitant velocity is 2.74  m/s, and with an assumed right atrial pressure of 8 mmHg, the estimated right ventricular systolic pressure is 38.0 mmHg. Left Atrium: Left atrial size was mildly dilated. Right Atrium: Right atrial size was mildly dilated. Pericardium: A moderately sized pericardial effusion is present. The pericardial effusion is circumferential. There is no evidence of cardiac tamponade. Mitral Valve: The mitral valve is normal in structure. Trivial mitral valve regurgitation. No evidence of mitral valve stenosis. Tricuspid Valve: The tricuspid valve is normal in structure. Tricuspid valve regurgitation is moderate . No evidence of tricuspid  stenosis. Aortic Valve: There is focal calcification on the noncoronary cusp. The aortic valve is tricuspid. Aortic valve regurgitation is not visualized. Aortic valve sclerosis/calcification is present, without any evidence of aortic stenosis. Aortic valve mean gradient measures 6.0 mmHg. Aortic valve peak gradient measures 9.5 mmHg. Aortic valve area, by VTI measures 2.05 cm. Pulmonic Valve: The pulmonic valve was normal in structure. Pulmonic valve regurgitation is not visualized. No evidence of pulmonic stenosis. Aorta: The aortic root is normal in size and structure. Venous: The inferior vena cava is dilated in size with greater than 50% respiratory variability, suggesting right atrial pressure of 8 mmHg. IAS/Shunts: No atrial level shunt detected by color flow Doppler.  LEFT VENTRICLE PLAX 2D LVIDd:         3.30 cm   Diastology LVIDs:         1.40 cm   LV e' medial:    8.05 cm/s LV PW:         1.70 cm   LV E/e' medial:  8.4 LV IVS:        1.40 cm   LV e' lateral:   5.66 cm/s LVOT diam:     1.80 cm   LV E/e' lateral: 11.9 LV SV:         76 LV SV Index:   44 LVOT Area:     2.54 cm  RIGHT VENTRICLE             IVC RV S prime:     16.30 cm/s  IVC diam: 2.20 cm TAPSE (M-mode): 1.8 cm LEFT ATRIUM             Index        RIGHT ATRIUM           Index LA Vol (A2C):   57.9 ml 34.03 ml/m  RA Area:     17.80 cm LA Vol (A4C):   46.0 ml 27.04 ml/m  RA Volume:   49.80 ml  29.27 ml/m LA Biplane Vol: 54.7 ml 32.15 ml/m  AORTIC VALVE AV Area (Vmax):    2.13 cm AV Area (Vmean):   2.08 cm AV Area (VTI):     2.05 cm AV Vmax:  154.00 cm/s AV Vmean:          119.000 cm/s AV VTI:            0.368 m AV Peak Grad:      9.5 mmHg AV Mean Grad:      6.0 mmHg LVOT Vmax:         129.00 cm/s LVOT Vmean:        97.300 cm/s LVOT VTI:          0.297 m LVOT/AV VTI ratio: 0.81  AORTA Ao Root diam: 2.50 cm Ao Asc diam:  3.00 cm MITRAL VALVE               TRICUSPID VALVE MV Area (PHT): 2.09 cm    TR Peak grad:   30.0 mmHg MV  Decel Time: 363 msec    TR Vmax:        274.00 cm/s MV E velocity: 67.35 cm/s MV A velocity: 92.80 cm/s  SHUNTS MV E/A ratio:  0.73        Systemic VTI:  0.30 m                            Systemic Diam: 1.80 cm Kardie Tobb DO Electronically signed by Thomasene Ripple DO Signature Date/Time: 10/30/2022/4:49:19 PM    Final    NM Pulmonary Perfusion  Result Date: 10/30/2022 CLINICAL DATA:  Elevated D-dimer. Chest pain. Pre syncopal episode. Evaluate for pulmonary embolism. EXAM: NUCLEAR MEDICINE PERFUSION LUNG SCAN TECHNIQUE: Perfusion images were obtained in multiple projections after intravenous injection of radiopharmaceutical. Ventilation scans intentionally deferred if perfusion scan and chest x-ray adequate for interpretation during COVID 19 epidemic. RADIOPHARMACEUTICALS:  4.4 mCi Tc-74m MAA IV COMPARISON:  Chest radiograph-10/29/2022; 12/31/2026 FINDINGS: Review of chest radiograph performed 10/29/2022 demonstrates unchanged enlarged cardiac silhouette and mediastinal contours. Post coronary stent placement. No focal airspace opacities. No pleural effusion or pneumothorax. No evidence of edema. Stigmata of dish within the thoracic spine. There is homogeneous distribution of injected radiotracer throughout the pulmonary parenchyma with relative area oligemia within the left lower lung attributable to cardiomegaly. No discrete areas of segmental or subsegmental non perfusion to suggest pulmonary embolism. IMPRESSION: Pulmonary embolism absent (low probability of pulmonary embolism. If clinical concern persists, further evaluation with chest CTA and/or bilateral lower extremity venous Doppler ultrasound could be performed as indicated Electronically Signed   By: Simonne Come M.D.   On: 10/30/2022 11:01   DG Chest 2 View  Result Date: 10/29/2022 CLINICAL DATA:  Palpitation shortness of breath EXAM: CHEST - 2 VIEW COMPARISON:  12/30/2016 FINDINGS: Cardiomegaly. No acute airspace disease, pleural effusion, or  pneumothorax. IMPRESSION: Cardiomegaly. Electronically Signed   By: Jasmine Pang M.D.   On: 10/29/2022 17:11     The results of significant diagnostics from this hospitalization (including imaging, microbiology, ancillary and laboratory) are listed below for reference.     Microbiology: Recent Results (from the past 240 hour(s))  Resp panel by RT-PCR (RSV, Flu A&B, Covid) Anterior Nasal Swab     Status: None   Collection Time: 10/29/22  3:28 PM   Specimen: Anterior Nasal Swab  Result Value Ref Range Status   SARS Coronavirus 2 by RT PCR NEGATIVE NEGATIVE Final   Influenza A by PCR NEGATIVE NEGATIVE Final   Influenza B by PCR NEGATIVE NEGATIVE Final    Comment: (NOTE) The Xpert Xpress SARS-CoV-2/FLU/RSV plus assay is intended as an aid in the diagnosis of influenza from  Nasopharyngeal swab specimens and should not be used as a sole basis for treatment. Nasal washings and aspirates are unacceptable for Xpert Xpress SARS-CoV-2/FLU/RSV testing.  Fact Sheet for Patients: BloggerCourse.com  Fact Sheet for Healthcare Providers: SeriousBroker.it  This test is not yet approved or cleared by the [place] FDA and has been authorized for detection and/or diagnosis of SARS-CoV-2 by FDA under an Emergency Use Authorization (EUA). This EUA will remain in effect (meaning this test can be used) for the duration of the COVID-19 declaration under Section 564(b)(1) of the Act, 21 U.S.C. section 360bbb-3(b)(1), unless the authorization is terminated or revoked.     Resp Syncytial Virus by PCR NEGATIVE NEGATIVE Final    Comment: (NOTE) Fact Sheet for Patients: BloggerCourse.com  Fact Sheet for Healthcare Providers: SeriousBroker.it  This test is not yet approved or cleared by the [place] FDA and has been authorized for detection and/or diagnosis of SARS-CoV-2 by FDA under an  Emergency Use Authorization (EUA). This EUA will remain in effect (meaning this test can be used) for the duration of the COVID-19 declaration under Section 564(b)(1) of the Act, 21 U.S.C. section 360bbb-3(b)(1), unless the authorization is terminated or revoked.  Performed at [facility], [address] [place], [place] [address]      Labs: BNP (last 3 results) Recent Labs    10/29/22 1600  BNP 85.6   Basic Metabolic Panel: Recent Labs  Lab 10/29/22 1600 10/30/22 0741  NA 130* 132*  K 4.2 3.3*  CL 94* 99  CO2 19* 20*  GLUCOSE 86 73  BUN 37* 29*  CREATININE 2.19* 1.65*  CALCIUM 9.3 9.1  MG 1.6* 1.8  PHOS  --  2.9   Liver Function Tests: No results for input(s): "AST", "ALT", "ALKPHOS", "BILITOT", "PROT", "ALBUMIN" in the last 168 hours. No results for input(s): "LIPASE", "AMYLASE" in the last 168 hours. No results for input(s): "AMMONIA" in the last 168 hours. CBC: Recent Labs  Lab 10/29/22 1600 10/29/22 2100  WBC 4.6 4.7  HGB 10.0* 9.3*  HCT 29.6* 27.9*  MCV 82.5 81.6  PLT 201 132*   Cardiac Enzymes: No results for input(s): "CKTOTAL", "CKMB", "CKMBINDEX", "TROPONINI" in the last 168 hours. BNP: Invalid input(s): "POCBNP" CBG: Recent Labs  Lab 10/29/22 2200 10/30/22 0747 10/30/22 1504  GLUCAP 68* 80 84   D-Dimer Recent Labs    10/29/22 1850  DDIMER >20.00*   Hgb A1c Recent Labs    10/30/22 0741  HGBA1C 5.5   Lipid Profile Recent Labs    10/30/22 0741  CHOL 170  HDL 38*  LDLCALC 114*  TRIG 88  CHOLHDL 4.5   Thyroid function studies No results for input(s): "TSH", "T4TOTAL", "T3FREE", "THYROIDAB" in the last 72 hours.  Invalid input(s): "FREET3" Anemia work up Recent Labs    10/30/22 0741  VITAMINB12 792  FERRITIN 383*  TIBC 207*  IRON 54   Urinalysis No results found for: "COLORURINE", "APPEARANCEUR", "LABSPEC", "PHURINE", "GLUCOSEU", "HGBUR", "BILIRUBINUR", "KETONESUR", "PROTEINUR", "UROBILINOGEN", "NITRITE",  "LEUKOCYTESUR" Sepsis Labs Recent Labs  Lab 10/29/22 1600 10/29/22 2100  WBC 4.6 4.7   Microbiology Recent Results (from the past 240 hour(s))  Resp panel by RT-PCR (RSV, Flu A&B, Covid) Anterior Nasal Swab     Status: None   Collection Time: 10/29/22  3:28 PM   Specimen: Anterior Nasal Swab  Result Value Ref Range Status   SARS Coronavirus 2 by RT PCR NEGATIVE NEGATIVE Final   Influenza A by PCR NEGATIVE NEGATIVE Final  Influenza B by PCR NEGATIVE NEGATIVE Final    Comment: (NOTE) The Xpert Xpress SARS-CoV-2/FLU/RSV plus assay is intended as an aid in the diagnosis of influenza from Nasopharyngeal swab specimens and should not be used as a sole basis for treatment. Nasal washings and aspirates are unacceptable for Xpert Xpress SARS-CoV-2/FLU/RSV testing.  Fact Sheet for Patients: BloggerCourse.com  Fact Sheet for Healthcare Providers: SeriousBroker.it  This test is not yet approved or cleared by the [place] FDA and has been authorized for detection and/or diagnosis of SARS-CoV-2 by FDA under an Emergency Use Authorization (EUA). This EUA will remain in effect (meaning this test can be used) for the duration of the COVID-19 declaration under Section 564(b)(1) of the Act, 21 U.S.C. section 360bbb-3(b)(1), unless the authorization is terminated or revoked.     Resp Syncytial Virus by PCR NEGATIVE NEGATIVE Final    Comment: (NOTE) Fact Sheet for Patients: BloggerCourse.com  Fact Sheet for Healthcare Providers: SeriousBroker.it  This test is not yet approved or cleared by the [place] FDA and has been authorized for detection and/or diagnosis of SARS-CoV-2 by FDA under an Emergency Use Authorization (EUA). This EUA will remain in effect (meaning this test can be used) for the duration of the COVID-19 declaration under Section 564(b)(1) of the Act, 21  U.S.C. section 360bbb-3(b)(1), unless the authorization is terminated or revoked.  Performed at [facility], [address], [place], [place] [address]      Time coordinating discharge:  I have spent 35 minutes face to face with the patient and on the ward discussing the patients care, assessment, plan and disposition with other care givers. >50% of the time was devoted counseling the patient about the risks and benefits of treatment/Discharge disposition and coordinating care.   SIGNED:   Miguel Rota, MD  [facility] 10/31/2022, 8:47 AM   If 7PM-7AM, please contact night-coverage

## 2022-11-12 ENCOUNTER — Emergency Department (HOSPITAL_COMMUNITY): Payer: Medicare HMO

## 2022-11-12 ENCOUNTER — Emergency Department (HOSPITAL_COMMUNITY)
Admission: EM | Admit: 2022-11-12 | Discharge: 2022-11-13 | Disposition: A | Discharge status: Home / Self Care | Payer: Medicare HMO | Attending: Emergency Medicine | Admitting: Emergency Medicine

## 2022-11-12 ENCOUNTER — Encounter (HOSPITAL_COMMUNITY): Payer: Self-pay | Admitting: Emergency Medicine

## 2022-11-12 DIAGNOSIS — I10 Essential (primary) hypertension: Secondary | ICD-10-CM | POA: Insufficient documentation

## 2022-11-12 DIAGNOSIS — Z79899 Other long term (current) drug therapy: Secondary | ICD-10-CM | POA: Diagnosis not present

## 2022-11-12 DIAGNOSIS — I2510 Atherosclerotic heart disease of native coronary artery without angina pectoris: Secondary | ICD-10-CM | POA: Insufficient documentation

## 2022-11-12 DIAGNOSIS — D649 Anemia, unspecified: Secondary | ICD-10-CM | POA: Diagnosis not present

## 2022-11-12 DIAGNOSIS — R002 Palpitations: Secondary | ICD-10-CM | POA: Insufficient documentation

## 2022-11-12 DIAGNOSIS — R55 Syncope and collapse: Secondary | ICD-10-CM

## 2022-11-12 DIAGNOSIS — Z7984 Long term (current) use of oral hypoglycemic drugs: Secondary | ICD-10-CM | POA: Insufficient documentation

## 2022-11-12 DIAGNOSIS — D72829 Elevated white blood cell count, unspecified: Secondary | ICD-10-CM | POA: Insufficient documentation

## 2022-11-12 DIAGNOSIS — R531 Weakness: Secondary | ICD-10-CM

## 2022-11-12 DIAGNOSIS — Z20822 Contact with and (suspected) exposure to covid-19: Secondary | ICD-10-CM | POA: Insufficient documentation

## 2022-11-12 DIAGNOSIS — R001 Bradycardia, unspecified: Secondary | ICD-10-CM | POA: Insufficient documentation

## 2022-11-12 DIAGNOSIS — R0602 Shortness of breath: Secondary | ICD-10-CM | POA: Insufficient documentation

## 2022-11-12 DIAGNOSIS — R110 Nausea: Secondary | ICD-10-CM | POA: Diagnosis not present

## 2022-11-12 LAB — CBC WITH DIFFERENTIAL/PLATELET
Abs Immature Granulocytes: 0.01 10*3/uL (ref 0.00–0.07)
Basophils Absolute: 0 10*3/uL (ref 0.0–0.1)
Basophils Relative: 1 %
Eosinophils Absolute: 0.1 10*3/uL (ref 0.0–0.5)
Eosinophils Relative: 3 %
HCT: 27.3 % — ABNORMAL LOW (ref 36.0–46.0)
Hemoglobin: 9.2 g/dL — ABNORMAL LOW (ref 12.0–15.0)
Immature Granulocytes: 0 %
Lymphocytes Relative: 30 %
Lymphs Abs: 1.1 10*3/uL (ref 0.7–4.0)
MCH: 27.8 pg (ref 26.0–34.0)
MCHC: 33.7 g/dL (ref 30.0–36.0)
MCV: 82.5 fL (ref 80.0–100.0)
Monocytes Absolute: 0.5 10*3/uL (ref 0.1–1.0)
Monocytes Relative: 12 %
Neutro Abs: 2 10*3/uL (ref 1.7–7.7)
Neutrophils Relative %: 54 %
Platelets: 256 10*3/uL (ref 150–400)
RBC: 3.31 MIL/uL — ABNORMAL LOW (ref 3.87–5.11)
RDW: 16.8 % — ABNORMAL HIGH (ref 11.5–15.5)
WBC: 3.6 10*3/uL — ABNORMAL LOW (ref 4.0–10.5)
nRBC: 0 % (ref 0.0–0.2)

## 2022-11-12 LAB — COMPREHENSIVE METABOLIC PANEL
ALT: 16 U/L (ref 0–44)
AST: 20 U/L (ref 15–41)
Albumin: 3.3 g/dL — ABNORMAL LOW (ref 3.5–5.0)
Alkaline Phosphatase: 68 U/L (ref 38–126)
Anion gap: 20 — ABNORMAL HIGH (ref 5–15)
BUN: 22 mg/dL (ref 8–23)
CO2: 18 mmol/L — ABNORMAL LOW (ref 22–32)
Calcium: 9.4 mg/dL (ref 8.9–10.3)
Chloride: 96 mmol/L — ABNORMAL LOW (ref 98–111)
Creatinine, Ser: 1.62 mg/dL — ABNORMAL HIGH (ref 0.44–1.00)
GFR, Estimated: 33 mL/min — ABNORMAL LOW (ref >60)
Glucose, Bld: 80 mg/dL (ref 70–99)
Potassium: 3.9 mmol/L (ref 3.5–5.1)
Sodium: 134 mmol/L — ABNORMAL LOW (ref 135–145)
Total Bilirubin: 1.9 mg/dL — ABNORMAL HIGH (ref 0.3–1.2)
Total Protein: 6.6 g/dL (ref 6.5–8.1)

## 2022-11-12 LAB — TROPONIN I (HIGH SENSITIVITY): Troponin I (High Sensitivity): 15 ng/L (ref <18)

## 2022-11-12 NOTE — ED Triage Notes (Signed)
Pt states she is feeling weak and faint. States she was seen at [facility] and suggested she go to hospital. She went home thinking she would feel better and has not. Reports feeling palpitation and SOB.

## 2022-11-13 LAB — URINALYSIS, ROUTINE W REFLEX MICROSCOPIC
Bilirubin Urine: NEGATIVE
Glucose, UA: NEGATIVE mg/dL
Hgb urine dipstick: NEGATIVE
Ketones, ur: 20 mg/dL — AB
Leukocytes,Ua: NEGATIVE
Nitrite: NEGATIVE
Protein, ur: NEGATIVE mg/dL
Specific Gravity, Urine: 1.016 (ref 1.005–1.030)
pH: 5 (ref 5.0–8.0)

## 2022-11-13 LAB — TROPONIN I (HIGH SENSITIVITY): Troponin I (High Sensitivity): 16 ng/L (ref <18)

## 2022-11-13 LAB — RESP PANEL BY RT-PCR (RSV, FLU A&B, COVID)  RVPGX2
Influenza A by PCR: NEGATIVE
Influenza B by PCR: NEGATIVE
Resp Syncytial Virus by PCR: NEGATIVE
SARS Coronavirus 2 by RT PCR: NEGATIVE

## 2022-11-13 LAB — TSH: TSH: 2.123 u[IU]/mL (ref 0.350–4.500)

## 2022-11-13 MED ORDER — SODIUM CHLORIDE 0.9 % IV BOLUS
500.0000 mL | Freq: Once | INTRAVENOUS | Status: AC
  Administered 2022-11-13: 500 mL via INTRAVENOUS

## 2022-11-13 MED ORDER — ONDANSETRON HCL 4 MG/2ML IJ SOLN
4.0000 mg | Freq: Once | INTRAMUSCULAR | Status: AC
  Administered 2022-11-13: 4 mg via INTRAVENOUS
  Filled 2022-11-13: qty 2

## 2022-11-13 NOTE — ED Provider Notes (Signed)
  Physical Exam  BP 118/61 (BP Location: Right Arm)   Pulse (!) 47   Temp (!) 97.3 F (36.3 C) (Oral)   Resp 15   SpO2 100%   Physical Exam Constitutional:      General: She is not in acute distress.    Appearance: She is not ill-appearing.  Skin:    General: Skin is warm and dry.     Findings: No rash.  Neurological:     Mental Status: She is alert.     Cranial Nerves: No cranial nerve deficit.     Sensory: No sensory deficit.     Procedures  Procedures  ED Course / MDM   Clinical Course as of 11/13/22 1634  Thu Nov 13, 2022  1524 S. Presented w/ weakness, near-syncope. Recent admission for same. Neuro exam good, has ambulated. HR in 40s while sleeping, 50s when awake. Workup unrevealing. Outpatient cards referral placed. F/u TSH, anticipate d/c. tolerated [KM]    Clinical Course User Index [KM] Lyman Speller, MD   Medical Decision Making Amount and/or Complexity of Data Reviewed Labs: ordered. Radiology: ordered.  Risk Prescription drug management.   Signout taken from a floor ED team.  In brief, this is a 77 year old female with past medical history of hypertension, hyperlipidemia, type 2 diabetes, coronary artery disease presenting for evaluation of generalized weakness and presyncopal sensations.  Recent admission for same from 10/29/2022 through 10/30/2022.  Workup reportedly unremarkable.  Plan to follow-up TSH, and if unrevealing discharge home with primary care and cardiology follow-up.  Patient's ED workup reviewed.  Regarding her vitals, she has had multiple recorded heart rates in the upper 40s, which typically occurs while sleeping.  When reviewing her recent hospitalization, she did have numerous documented heart rates in the low 50s.  I do not suspect that this is a new bradycardia for her.  EKG reviewed and does not show any high-grade AV block.  Remainder of ED workup reviewed.  Urinalysis not consistent with infection.  She was negative for COVID and flu.   Troponins 15, 16 and unremarkable.  Hemoglobin of 9.2 only mildly decreased from 10 just a few weeks ago.  Creatinine is 1.62 -she has been tolerating oral intake.  Attempting to limit IV fluids given critical nationwide shortage.  Do not feel that she needs aggressive replenishment given po tolerability.  TSH returned within normal limits.  I reevaluated the patient myself and discussed the above findings with her.  She was eating a sandwich and drinking juice.  She was very reassured by above workup.  She states that she wants to go home, which I feel is reasonable given the unremarkable workup.  When I was in the room with her, her heart rate was consistently in the mid 50s with the blood pressure systolic greater than 115.  I have low suspicion for an acute process such as anemia, arrhythmia, ACS/MI, hyperglycemia, AAA, dissection, PE, SAH, seizure as cause of her symptoms.  Patient will be discharged home with instruction to follow-up with both primary care and wellness cardiology for outpatient follow-up.  She is understanding and agreeable to this plan.  Strict return precautions provided.      Lyman Speller, MD 11/13/22 1637    Jacalyn Lefevre, MD 11/13/22 2153

## 2022-11-13 NOTE — ED Provider Notes (Signed)
[facility] [facility] EMERGENCY DEPARTMENT AT [facility] Provider Note   CSN: [id number] Arrival date & time: 11/12/22  2009     History  Chief Complaint  Patient presents with   Weakness    Dana Beltran is a 77 y.o. female with past medical history significant for CAD, hypertension, hyperlipidemia, AKI, MI presents to the ED complaining of weakness, palpitations, and shortness of breath.  Patient states she feels weak and like she is going to pass out, but has not had syncopal episodes.  Patient was seen at urgent care yesterday and was told to go to the ED for further evaluation.  Patient went home thinking she would feel better, but began to feel worse prompting her to come to the ED last night.  She states she has palpitations and feels short of breath when she is moving around.  She reports she also continues to have nausea and has had decreased oral intake.  Denies abdominal pain, diarrhea, chest pain, fever, cough.        Home Medications Prior to Admission medications   Medication Sig Start Date End Date Taking? Authorizing Provider  amLODipine (NORVASC) 10 MG tablet TAKE 1 TABLET BY MOUTH  DAILY 12/26/19  Yes Yates Decamp, MD  ASPIRIN 81 PO Take 81 mg by mouth daily.   Yes [provider]  atorvastatin (LIPITOR) 40 MG tablet Take 40 mg by mouth daily.   Yes [provider]  benzonatate (TESSALON) 100 MG capsule Take 100 mg by mouth 3 (three) times daily as needed for cough. 11/06/22  Yes [provider]  Biotin 10 MG TABS Take 1 tablet by mouth daily.   Yes [provider]  Coenzyme Q10 (CO Q-10) 100 MG CAPS Take 1 capsule by mouth daily.   Yes [provider]  Ginkgo Biloba 40 MG TABS Take 1 tablet by mouth daily.   Yes [provider]  Glucosamine-Chondroit-Vit C-Mn (GLUCOSAMINE 1500 COMPLEX PO) Take 1 tablet by mouth daily.   Yes [provider]  metFORMIN (GLUCOPHAGE-XR) 500 MG 24 hr tablet Take 500 mg by mouth 2  (two) times daily with a meal.   Yes [provider]  metoprolol succinate (TOPROL-XL) 50 MG 24 hr tablet TAKE 1 TABLET BY MOUTH  DAILY 08/20/20  Yes Yates Decamp, MD  Multiple Vitamins-Minerals (CENTRUM SILVER PO) Take 1 tablet by mouth daily.   Yes [provider]  ondansetron (ZOFRAN) 4 MG tablet Take 4 mg by mouth 2 (two) times daily as needed for nausea or vomiting. 11/07/22  Yes [provider]  valsartan-hydrochlorothiazide (DIOVAN-HCT) 320-12.5 MG tablet TAKE 1 TABLET BY MOUTH  DAILY 03/16/20  Yes Yates Decamp, MD  Vitamin A 3 MG (10000 UT) TABS Take 10,000 Units by mouth daily.   Yes [provider]      Allergies    Caffeine, Coconut (cocos nucifera), Orange oil, Pineapple, Prunus persica, Strawberry extract, Tomato, Dust mite extract, Other, Chocolate, Citrus, and Peach flavor    Review of Systems   Review of Systems  Constitutional:  Negative for fever.  Respiratory:  Positive for shortness of breath. Negative for cough.   Cardiovascular:  Positive for palpitations. Negative for chest pain.  Gastrointestinal:  Positive for nausea and vomiting. Negative for abdominal pain and diarrhea.  Neurological:  Positive for weakness and light-headedness. Negative for syncope.    Physical Exam Updated Vital Signs BP (!) 131/58   Pulse (!) 48   Temp 98 F (36.7 C) (Oral)  Resp 13   SpO2 100%  Physical Exam Vitals and nursing note reviewed.  Constitutional:      General: She is not in acute distress.    Appearance: Normal appearance. She is ill-appearing. She is not diaphoretic.  HENT:     Mouth/Throat:     Mouth: Mucous membranes are dry.     Pharynx: Oropharynx is clear. Uvula midline.  Cardiovascular:     Rate and Rhythm: Normal rate and regular rhythm.  Pulmonary:     Effort: Pulmonary effort is normal.  Skin:    General: Skin is warm and dry.     Capillary Refill: Capillary refill takes less than 2 seconds.  Neurological:     Mental  Status: She is alert. Mental status is at baseline.     Cranial Nerves: No cranial nerve deficit.     Sensory: No sensory deficit.     Motor: No weakness, abnormal muscle tone or pronator drift.     Comments: Gait not assessed.  Psychiatric:        Mood and Affect: Mood normal.        Behavior: Behavior normal.     ED Results / Procedures / Treatments   Labs (all labs ordered are listed, but only abnormal results are displayed) Labs Reviewed  COMPREHENSIVE METABOLIC PANEL - Abnormal; Notable for the following components:      Result Value   Sodium 134 (*)    Chloride 96 (*)    CO2 18 (*)    Creatinine, Ser 1.62 (*)    Albumin 3.3 (*)    Total Bilirubin 1.9 (*)    GFR, Estimated 33 (*)    Anion gap 20 (*)    All other components within normal limits  CBC WITH DIFFERENTIAL/PLATELET - Abnormal; Notable for the following components:   WBC 3.6 (*)    RBC 3.31 (*)    Hemoglobin 9.2 (*)    HCT 27.3 (*)    RDW 16.8 (*)    All other components within normal limits  URINALYSIS, ROUTINE W REFLEX MICROSCOPIC - Abnormal; Notable for the following components:   Ketones, ur 20 (*)    All other components within normal limits  RESP PANEL BY RT-PCR (RSV, FLU A&B, COVID)  RVPGX2  TSH  TROPONIN I (HIGH SENSITIVITY)  TROPONIN I (HIGH SENSITIVITY)    EKG EKG Interpretation Date/Time:  Wednesday November 12 2022 20:38:50 EDT Ventricular Rate:  56 PR Interval:  170 QRS Duration:  84 QT Interval:  410 QTC Calculation: 395 R Axis:   -5  Text Interpretation: Sinus bradycardia Left ventricular hypertrophy with repolarization abnormality ( R in aVL ) Abnormal ECG When compared with ECG of 30-Oct-2022 08:30, PREVIOUS ECG IS PRESENT New T wave inversion V5 compared to last Confirmed by Anders Simmonds [phone]) on 11/12/2022 8:57:47 PM  Radiology DG Chest 2 View  Result Date: 11/12/2022 CLINICAL DATA:  Weakness.  Palpitations and shortness of breath. EXAM: CHEST - 2 VIEW COMPARISON:   10/29/2022 FINDINGS: Stable cardiomegaly. Coronary stenting. Aortic atherosclerotic calcification. No focal consolidation, pleural effusion, or pneumothorax. No displaced rib fractures. IMPRESSION: No active cardiopulmonary disease. Electronically Signed   By: Minerva Fester M.D.   On: 11/12/2022 22:47    Procedures Procedures    Medications Ordered in ED Medications  sodium chloride 0.9 % bolus 500 mL (0 mLs Intravenous Stopped 11/13/22 1045)  ondansetron (ZOFRAN) injection 4 mg (4 mg Intravenous Given 11/13/22 0851)    ED Course/ Medical Decision Making/ A&P Clinical  Course as of 11/13/22 1541  Thu Nov 13, 2022  1524 S. Presented w/ weakness, near-syncope. Recent admission for same. Neuro exam good, has ambulated. HR in 40s while sleeping, 50s when awake. Workup unrevealing. Outpatient cards referral placed. F/u TSH, anticipate d/c. tolerated [KM]    Clinical Course User Index [KM] Lyman Speller, MD                                 Medical Decision Making Amount and/or Complexity of Data Reviewed Labs: ordered. Radiology: ordered.  Risk Prescription drug management.   This patient presents to the ED with chief complaint(s) of weakness, near syncope, palpitations, shortness of breath with pertinent past medical history of AKI, CAD, MI, HTN.  The complaint involves an extensive differential diagnosis and also carries with it a high risk of complications and morbidity.    The differential diagnosis includes ACS, cardiac dysrhythmia, metabolic derangement, dehydration   The initial plan is to obtain labs, ECG, CXR  Initial Assessment:   Exam significant for ill-appearing patient who is not in acute distress.  She appears fatigued.  Skin and mucus membranes are dry.  She is bradycardic in the 50s with regular rhythm.  Bilateral non-pitting pedal edema present.  Lungs clear to auscultation bilaterally.  5/5 strength in all extremities, grip strength equal.  No cranial nerve or  sensory deficits.    Independent ECG/labs interpretation:  The following labs were independently interpreted:  CBC with persistent anemia, new leukocytosis when compared to prior.  Metabolic panel with Cr above patient's baseline at 1.62.  Anion gap elevated at 20.  Independent visualization and interpretation of imaging: I independently visualized the following imaging with scope of interpretation limited to determining acute life threatening conditions related to emergency care: chest x-ray, which revealed stable cardiomegaly, no other abnormalities.   Treatment and Reassessment: Patient with evidence of reduced kidney function and elevated anion gap, likely related to dehydration.  Will give 500 mL NS.  Patient also given Zofran for nausea.  Will attempt PO hydration as well.    Patient was able to ambulate to the bathroom without difficulty.  She reports still feeling weak.  Discussed case with attending, Alona Bene, who added on TSH.  Will continue to push PO hydration while awaiting results.  She has been tolerating oral fluids.    Patient would likely benefit from cardiology referral for outpatient cardiac monitoring since she has had bradycardia and feeling of near syncope/weakness.  Patient did have extensive workup while in the hospital in September when she presented with similar symptoms.  I do not feel that patient requires hospital admission at this time.    Disposition:   Care of patient transferred to resident Lyman Speller and attending Jacalyn Lefevre at end of shift.  Plan at time of hand off is follow up on TSH results.  Patient will be appropriate for discharge home if she continues to tolerate PO intake and is able to continue ambulating without difficulty.  Cardiology referral has already been placed for outpatient follow-up.           Final Clinical Impression(s) / ED Diagnoses Final diagnoses:  Generalized weakness  Near syncope  Bradycardia  Palpitations     Rx / DC Orders ED Discharge Orders          Ordered    Ambulatory referral to Cardiology       Comments: If you have not  heard from the Cardiology office within the next 72 hours please call [phone].   11/13/22 1514              Lenard Simmer, PA-C 11/13/22 1541    Maia Plan, MD 11/14/22 [phone]

## 2022-11-13 NOTE — Discharge Instructions (Signed)
Dana Beltran:  Thank you for allowing [place] to take care of you today.  We hope you begin feeling better soon. You were seen today for generalized weakness  To-Do: Please follow-up with your primary doctor to schedule an appointment with a new primary care doctor within the next 2-3 days. A referral to follow-up with cardiology has been placed.  They will contact you to schedule this appointment.  Please return to the Emergency Department or call 911 if you experience chest pain, shortness of breath, severe pain, severe fever, altered mental status, or have any reason to think that you need emergency medical care.  Thank you again.  Hope you feel better soon.

## 2022-11-28 ENCOUNTER — Emergency Department (HOSPITAL_COMMUNITY)
Admission: EM | Admit: 2022-11-28 | Discharge: 2022-11-28 | Disposition: A | Discharge status: Home / Self Care | Payer: Medicare HMO | Attending: Emergency Medicine | Admitting: Emergency Medicine

## 2022-11-28 ENCOUNTER — Other Ambulatory Visit: Payer: Self-pay

## 2022-11-28 ENCOUNTER — Emergency Department (HOSPITAL_COMMUNITY): Payer: Medicare HMO

## 2022-11-28 ENCOUNTER — Encounter (HOSPITAL_COMMUNITY): Payer: Self-pay | Admitting: Emergency Medicine

## 2022-11-28 DIAGNOSIS — D649 Anemia, unspecified: Secondary | ICD-10-CM | POA: Insufficient documentation

## 2022-11-28 DIAGNOSIS — Z7982 Long term (current) use of aspirin: Secondary | ICD-10-CM | POA: Diagnosis not present

## 2022-11-28 DIAGNOSIS — E876 Hypokalemia: Secondary | ICD-10-CM | POA: Insufficient documentation

## 2022-11-28 DIAGNOSIS — R112 Nausea with vomiting, unspecified: Secondary | ICD-10-CM | POA: Insufficient documentation

## 2022-11-28 DIAGNOSIS — R42 Dizziness and giddiness: Secondary | ICD-10-CM | POA: Diagnosis present

## 2022-11-28 LAB — URINALYSIS, ROUTINE W REFLEX MICROSCOPIC
Glucose, UA: NEGATIVE mg/dL
Hgb urine dipstick: NEGATIVE
Ketones, ur: 20 mg/dL — AB
Nitrite: NEGATIVE
Protein, ur: 30 mg/dL — AB
Specific Gravity, Urine: 1.024 (ref 1.005–1.030)
pH: 5 (ref 5.0–8.0)

## 2022-11-28 LAB — COMPREHENSIVE METABOLIC PANEL
ALT: 13 U/L (ref 0–44)
AST: 16 U/L (ref 15–41)
Albumin: 3.4 g/dL — ABNORMAL LOW (ref 3.5–5.0)
Alkaline Phosphatase: 61 U/L (ref 38–126)
Anion gap: 15 (ref 5–15)
BUN: 25 mg/dL — ABNORMAL HIGH (ref 8–23)
CO2: 24 mmol/L (ref 22–32)
Calcium: 9.4 mg/dL (ref 8.9–10.3)
Chloride: 101 mmol/L (ref 98–111)
Creatinine, Ser: 1.39 mg/dL — ABNORMAL HIGH (ref 0.44–1.00)
GFR, Estimated: 40 mL/min — ABNORMAL LOW (ref >60)
Glucose, Bld: 117 mg/dL — ABNORMAL HIGH (ref 70–99)
Potassium: 3.1 mmol/L — ABNORMAL LOW (ref 3.5–5.1)
Sodium: 140 mmol/L (ref 135–145)
Total Bilirubin: 1.7 mg/dL — ABNORMAL HIGH (ref 0.3–1.2)
Total Protein: 6.8 g/dL (ref 6.5–8.1)

## 2022-11-28 LAB — CBC
HCT: 23.9 % — ABNORMAL LOW (ref 36.0–46.0)
Hemoglobin: 8.2 g/dL — ABNORMAL LOW (ref 12.0–15.0)
MCH: 28.4 pg (ref 26.0–34.0)
MCHC: 34.3 g/dL (ref 30.0–36.0)
MCV: 82.7 fL (ref 80.0–100.0)
Platelets: 263 10*3/uL (ref 150–400)
RBC: 2.89 MIL/uL — ABNORMAL LOW (ref 3.87–5.11)
RDW: 18.7 % — ABNORMAL HIGH (ref 11.5–15.5)
WBC: 6.2 10*3/uL (ref 4.0–10.5)
nRBC: 0.5 % — ABNORMAL HIGH (ref 0.0–0.2)

## 2022-11-28 LAB — I-STAT CHEM 8, ED
BUN: 31 mg/dL — ABNORMAL HIGH (ref 8–23)
Calcium, Ion: 1.03 mmol/L — ABNORMAL LOW (ref 1.15–1.40)
Chloride: 103 mmol/L (ref 98–111)
Creatinine, Ser: 1.2 mg/dL — ABNORMAL HIGH (ref 0.44–1.00)
Glucose, Bld: 112 mg/dL — ABNORMAL HIGH (ref 70–99)
HCT: 26 % — ABNORMAL LOW (ref 36.0–46.0)
Hemoglobin: 8.8 g/dL — ABNORMAL LOW (ref 12.0–15.0)
Potassium: 4 mmol/L (ref 3.5–5.1)
Sodium: 140 mmol/L (ref 135–145)
TCO2: 26 mmol/L (ref 22–32)

## 2022-11-28 LAB — LIPASE, BLOOD: Lipase: 34 U/L (ref 11–51)

## 2022-11-28 MED ORDER — ONDANSETRON HCL 4 MG PO TABS: 4.0000 mg | ORAL_TABLET | Freq: Three times a day (TID) | ORAL | Status: DC | PRN

## 2022-11-28 MED ORDER — LACTATED RINGERS IV BOLUS
1000.0000 mL | Freq: Once | INTRAVENOUS | Status: AC
  Administered 2022-11-28: 1000 mL via INTRAVENOUS

## 2022-11-28 MED ORDER — IOHEXOL 350 MG/ML SOLN
60.0000 mL | Freq: Once | INTRAVENOUS | Status: AC | PRN
  Administered 2022-11-28: 60 mL via INTRAVENOUS

## 2022-11-28 MED ORDER — ONDANSETRON HCL 4 MG/2ML IJ SOLN
4.0000 mg | Freq: Once | INTRAMUSCULAR | Status: AC
  Administered 2022-11-28: 4 mg via INTRAVENOUS
  Filled 2022-11-28: qty 2

## 2022-11-28 NOTE — ED Provider Notes (Signed)
[place] EMERGENCY DEPARTMENT AT [facility] Provider Note   CSN: [id number] Arrival date & time: 11/28/22  1053     History {Add pertinent medical, surgical, social history, OB history to HPI:1} Chief Complaint  Patient presents with  . Constipation    Emesis     Dana Beltran is a 77 y.o. female.  HPI 77 year old female presents today complaining of nausea vomiting for the past week.  States she has not been able to keep anything down.  She has had decreased stooling secondary to this.  She denies fever or chills.  She denies urinary tract infection symptoms.  She states she took an antiemetic but it made her not feel well so she did not take anymore.  Reviewed chart from urgent care.  She was seen for nausea and cyst there today.  There she complained of dizziness and tachycardia and was found to be hypertensive. Reviewed recent ED evaluation in September for neurolysed weakness and near syncope.  At that time she had some elevated creatinine from baseline and anemia with hemoglobin of 9.2     Home Medications Prior to Admission medications   Medication Sig Start Date End Date Taking? Authorizing Provider  amLODipine (NORVASC) 10 MG tablet TAKE 1 TABLET BY MOUTH  DAILY 12/26/19   Yates Decamp, MD  ASPIRIN 81 PO Take 81 mg by mouth daily.    [provider]  atorvastatin (LIPITOR) 40 MG tablet Take 40 mg by mouth daily.    [provider]  benzonatate (TESSALON) 100 MG capsule Take 100 mg by mouth 3 (three) times daily as needed for cough. 11/06/22   [provider]  Biotin 10 MG TABS Take 1 tablet by mouth daily.    [provider]  Coenzyme Q10 (CO Q-10) 100 MG CAPS Take 1 capsule by mouth daily.    [provider]  Ginkgo Biloba 40 MG TABS Take 1 tablet by mouth daily.    [provider]  Glucosamine-Chondroit-Vit C-Mn (GLUCOSAMINE 1500 COMPLEX PO) Take 1 tablet by mouth daily.    [provider]  metFORMIN  (GLUCOPHAGE-XR) 500 MG 24 hr tablet Take 500 mg by mouth 2 (two) times daily with a meal.    [provider]  metoprolol succinate (TOPROL-XL) 50 MG 24 hr tablet TAKE 1 TABLET BY MOUTH  DAILY 08/20/20   Yates Decamp, MD  Multiple Vitamins-Minerals (CENTRUM SILVER PO) Take 1 tablet by mouth daily.    [provider]  ondansetron (ZOFRAN) 4 MG tablet Take 4 mg by mouth 2 (two) times daily as needed for nausea or vomiting. 11/07/22   [provider]  valsartan-hydrochlorothiazide (DIOVAN-HCT) 320-12.5 MG tablet TAKE 1 TABLET BY MOUTH  DAILY 03/16/20   Yates Decamp, MD  Vitamin A 3 MG (10000 UT) TABS Take 10,000 Units by mouth daily.    [provider]      Allergies    Caffeine, Coconut (cocos nucifera), Orange oil, Pineapple, Prunus persica, Strawberry extract, Tomato, Dust mite extract, Other, Chocolate, Citrus, and Peach flavor    Review of Systems   Review of Systems  Physical Exam Updated Vital Signs BP (!) 157/72   Pulse (!) 58   Temp 97.8 F (36.6 C) (Oral)   Resp 18   Ht 1.575 m (5\' 2" )   Wt 64.4 kg   SpO2 100%   BMI 25.97 kg/m  Physical Exam Vitals and nursing note reviewed.  Constitutional:      General: She is not in  acute distress.    Appearance: She is well-developed.  HENT:     Head: Normocephalic and atraumatic.     Right Ear: External ear normal.     Left Ear: External ear normal.     Nose: Nose normal.  Eyes:     Conjunctiva/sclera: Conjunctivae normal.     Pupils: Pupils are equal, round, and reactive to light.  Cardiovascular:     Rate and Rhythm: Normal rate and regular rhythm.     Pulses: Normal pulses.  Pulmonary:     Effort: Pulmonary effort is normal.  Abdominal:     General: Abdomen is flat. Bowel sounds are normal.     Palpations: Abdomen is soft.     Comments: Mild diffuse lower abdominal tenderness to palpation  Musculoskeletal:        General: Normal range of motion.     Cervical back: Normal range of motion and  neck supple.  Skin:    General: Skin is warm and dry.     Capillary Refill: Capillary refill takes less than 2 seconds.  Neurological:     Mental Status: She is alert and oriented to person, place, and time.     Motor: No abnormal muscle tone.     Coordination: Coordination normal.  Psychiatric:        Behavior: Behavior normal.        Thought Content: Thought content normal.    ED Results / Procedures / Treatments   Labs (all labs ordered are listed, but only abnormal results are displayed) Labs Reviewed  COMPREHENSIVE METABOLIC PANEL - Abnormal; Notable for the following components:      Result Value   Potassium 3.1 (*)    Glucose, Bld 117 (*)    BUN 25 (*)    Creatinine, Ser 1.39 (*)    Albumin 3.4 (*)    Total Bilirubin 1.7 (*)    GFR, Estimated 40 (*)    All other components within normal limits  CBC - Abnormal; Notable for the following components:   RBC 2.89 (*)    Hemoglobin 8.2 (*)    HCT 23.9 (*)    RDW 18.7 (*)    nRBC 0.5 (*)    All other components within normal limits  URINALYSIS, ROUTINE W REFLEX MICROSCOPIC - Abnormal; Notable for the following components:   Color, Urine AMBER (*)    APPearance HAZY (*)    Bilirubin Urine SMALL (*)    Ketones, ur 20 (*)    Protein, ur 30 (*)    Leukocytes,Ua MODERATE (*)    Bacteria, UA FEW (*)    All other components within normal limits  I-STAT CHEM 8, ED - Abnormal; Notable for the following components:   BUN 31 (*)    Creatinine, Ser 1.20 (*)    Glucose, Bld 112 (*)    Calcium, Ion 1.03 (*)    Hemoglobin 8.8 (*)    HCT 26.0 (*)    All other components within normal limits  LIPASE, BLOOD    EKG None  Radiology No results found.  Procedures Procedures  {Document cardiac monitor, telemetry assessment procedure when appropriate:1}  Medications Ordered in ED Medications  lactated ringers bolus 1,000 mL (1,000 mLs Intravenous New Bag/Given 11/28/22 1513)  ondansetron (ZOFRAN) injection 4 mg (4 mg  Intravenous Given 11/28/22 1513)  iohexol (OMNIPAQUE) 350 MG/ML injection 60 mL (60 mLs Intravenous Contrast Given 11/28/22 1600)    ED Course/ Medical Decision Making/ A&P Clinical Course as of 11/28/22 1654  Fri Nov 28, 2022  1340 Complete metabolic panel reviewed interpreted significant for hypokalemia with potassium of 3.1, stable renal insufficiency creatinine 1.39 Total bilirubin elevated 1.7 stable from first prior [DR]  1341 CBC reviewed interpreted significant for anemia with hemoglobin of 8.2 [DR]  1341 Hemoglobin decreased from first prior of 9.2 [DR]    Clinical Course User Index [DR] Margarita Grizzle, MD   {   Click here for ABCD2, HEART and other calculatorsREFRESH Note before signing :1}                              Medical Decision Making Amount and/or Complexity of Data Reviewed Labs: ordered. Radiology: ordered.  Risk Prescription drug management.   1 nausea vomiting-patient with nausea and vomiting for the past week.  Here she is treated with IV fluids and Zofran.  She feels improved.  She has some mild hypokalemia likely secondary to the nausea and vomiting.  Today her creatinine is trending down from 10/29/2022 was elevated at 2.19 down to 1.20 today. 2 hypokalemia potassium 3.1 has increased to 4.0 here in the ED. 3-anemia patient's hemoglobin is 8.8 has trended down from 9.3 at September 25.  However, simultaneously patient's creatinine is improved and's probable improved hydration status.  She is appears hemodynamically stable.  But will need outpatient workup  4:58 PM CT obtained and results pending. Patient feels improved. She is having oral fluid challenge  {Document critical care time when appropriate:1} {Document review of labs and clinical decision tools ie heart score, Chads2Vasc2 etc:1}  {Document your independent review of radiology images, and any outside records:1} {Document your discussion with family members, caretakers, and with  consultants:1} {Document social determinants of health affecting pt's care:1} {Document your decision making why or why not admission, treatments were needed:1} Final Clinical Impression(s) / ED Diagnoses Final diagnoses:  None    Rx / DC Orders ED Discharge Orders     None

## 2022-11-28 NOTE — ED Triage Notes (Signed)
PT arrives EMS from urgent care with constipation nausea and vomiting for the last week. Unable to keep medications down. Pt alert, oriented x4. Denies recent fever. Sent here for further eval. Denies pain.

## 2022-11-28 NOTE — Discharge Instructions (Addendum)
While you are in the emergency department, you had labs done, and they were normal for you.  You are to CT scan that was normal.  At this time, I do not have a clear cause for what is been causing your vomiting, but you can take Zofran, which is an antinausea medicine for this.  Please follow-up with your primary care doctor on Monday or Tuesday.

## 2022-11-28 NOTE — ED Notes (Signed)
Pt given ginger ale, will notify MD about whether or not the pt is able to keep it down.

## 2022-11-28 NOTE — ED Provider Notes (Signed)
  Physical Exam  BP (!) 157/72   Pulse (!) 58   Temp 97.8 F (36.6 C) (Oral)   Resp 18   Ht 5\' 2"  (1.575 m)   Wt 64.4 kg   SpO2 100%   BMI 25.97 kg/m   Physical Exam  Procedures  Procedures  ED Course / MDM   Clinical Course as of 11/28/22 1751  Fri Nov 28, 2022  1340 Complete metabolic panel reviewed interpreted significant for hypokalemia with potassium of 3.1, stable renal insufficiency creatinine 1.39 Total bilirubin elevated 1.7 stable from first prior [DR]  1341 CBC reviewed interpreted significant for anemia with hemoglobin of 8.2 [DR]  1341 Hemoglobin decreased from first prior of 9.2 [DR]    Clinical Course User Index [DR] Margarita Grizzle, MD   Medical Decision Making Amount and/or Complexity of Data Reviewed Labs: ordered. Radiology: ordered.  Risk Prescription drug management.   Dana Beltran, assumed care for this patient.  In brief 77 year old female who is here today for vomiting for 1 week.  Patient was signed out pending CT imaging, p.o. challenge.  On my assessment of the patient, she has been tolerating p.o. since receiving Zofran.  She is not having any abdominal pain.  Patient overall looks well.  Discussed patient's results with her, she wished to go home with prescription for Zofran.  Return precautions were discussed with the patient at bedside.       Dana Simmonds T, DO 11/28/22 1755

## 2022-12-05 ENCOUNTER — Other Ambulatory Visit (HOSPITAL_COMMUNITY): Payer: Self-pay | Admitting: Cardiology

## 2022-12-05 ENCOUNTER — Telehealth (HOSPITAL_COMMUNITY): Payer: Self-pay | Admitting: *Deleted

## 2022-12-05 DIAGNOSIS — R0602 Shortness of breath: Secondary | ICD-10-CM

## 2022-12-05 NOTE — Telephone Encounter (Signed)
Patient given detailed instructions per Myocardial Perfusion Study Information Sheet for the test on 12/09/2022 at 10:15. Patient notified to arrive 15 minutes early and that it is imperative to arrive on time for appointment to keep from having the test rescheduled.  If you need to cancel or reschedule your appointment, please call the office within 24 hours of your appointment. . Patient verbalized understanding.Daneil Dolin

## 2022-12-09 ENCOUNTER — Ambulatory Visit (HOSPITAL_COMMUNITY): Payer: Medicare HMO

## 2022-12-09 NOTE — Progress Notes (Unsigned)
Cardiology Office Note:  .   Date:  12/09/2022  ID:  Dana Beltran, DOB May 12, 1945, MRN [id number] PCP: Pcp, No  [facility] Providers Cardiologist:  None {  History of Present Illness: .   Dana Beltran is a 77 y.o. 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  Average blood pressure 138/77, heart rate 55.  Overall has been doing well at last office visit 10/22.  Patient was living alone.  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  Since the pandemic has been a lot less active although she just began tutoring again.  She denied chest pain, shortness of breath, palpitations, leg edema, orthopnea, PND, TIA/syncope.  Blood pressure was elevated initially.  She did admit to missing her medications when she gets busy with her daily activities.  ROS: ***  Studies Reviewed: Marland Kitchen        EKG 11/14/2020; Sinus rhythm 55 bpm Nonspecific T-abnormality   Echocardiogram 11/07/2020:  Left ventricle cavity is normal in size. Mild concentric hypertrophy of  the left ventricle. Normal global wall motion. Normal LV systolic function  with EF 56%. Doppler evidence of grade I (impaired) diastolic dysfunction,  normal LAP.  Left atrial cavity is mildly dilated.  Trileaflet aortic valve with mild aortic valve leaflet calcification.  Trace aortic stenosis. No regurgitation.  Moderate tricuspid regurgitation. Estimated pulmonary artery systolic  pressure 32 mmHg.  Mild pulmonic regurgitation.  Trace pericardial effusion, less than previous study on 04/13/2020.   Echocardiogram 04/13/2020:  Normal LV systolic function with visual EF 60-65%. Left ventricle cavity  is normal in size. Moderate left ventricular hypertrophy. Normal global  wall motion. Indeterminate diastolic filling pattern, normal LAP.  Left atrial cavity is mildly dilated.  Mild (Grade I) mitral regurgitation.   Mild tricuspid regurgitation. No evidence of pulmonary hypertension. RVSP  measures 30 mmHg.  Mild pulmonic regurgitation.  Moderate pericardial effusion (1cm). There is no hemodynamic significance.  Compared to prior study dated 2017: No significant change except  pericardial effusion was reported mild but now moderate in size.    EKG 03/09/2020: Sinus rhythm 55 bpm Old anteroseptal infarct Lateral T wave inversion, cannot exclude ischemia     Echocardiogram 2017:  1. Left ventricle cavity is normal. Moderate concentric hypertrophy of the left ventricle with suggestion of speckled pattern.calculated EF 56% 2. Left atrial cavity is mildly dilated at 4.1 cm. 3. Mild tricuspid regurgitation. No evidence of pulmonary hypertension. 4. Small pericardial effusion with clear fluids. No 2-D evidence of tamponade     Bilateral Carotid 09/26/2015: Minimal stenosis in both the common and proximal internal carotid artery with diffuse soft plaque. Right distal ICA not visualized on right to difficult anatomy.Bilateral carotid vessels demonstrate trotuosity and may be a source of bruit.      Risk Assessment/Calculations:   {Does this patient have ATRIAL FIBRILLATION?[phone]} No BP recorded.  {Refresh Note OR Click here to enter BP  :1}***       Physical Exam:   VS:  There were no vitals taken for this visit.   Wt Readings from Last 3 Encounters:  11/28/22 142 lb (64.4 kg)  10/29/22 152 lb (68.9 kg)  11/14/20 212 lb (96.2 kg)    GEN: Well nourished, well developed in no acute distress NECK: No JVD; No carotid bruits CARDIAC: ***RRR, no murmurs, rubs, gallops RESPIRATORY:  Clear to auscultation without rales, wheezing or rhonchi  ABDOMEN: Soft, non-tender, non-distended EXTREMITIES:  No edema; No deformity   ASSESSMENT AND PLAN: .   CAD without angina Hypertension Hyperlipidemia Pericardial effusion      Dispo: ***  Signed, Sharlene Dory, PA-C

## 2022-12-10 ENCOUNTER — Ambulatory Visit: Payer: Medicare HMO | Attending: Physician Assistant | Admitting: Physician Assistant

## 2022-12-10 ENCOUNTER — Encounter: Payer: Self-pay | Admitting: Physician Assistant

## 2022-12-10 ENCOUNTER — Other Ambulatory Visit: Payer: Self-pay | Admitting: *Deleted

## 2022-12-10 VITALS — BP 112/60 | HR 78 | Ht 62.0 in | Wt 143.2 lb

## 2022-12-10 DIAGNOSIS — I3139 Other pericardial effusion (noninflammatory): Secondary | ICD-10-CM

## 2022-12-10 DIAGNOSIS — E785 Hyperlipidemia, unspecified: Secondary | ICD-10-CM | POA: Diagnosis not present

## 2022-12-10 DIAGNOSIS — I10 Essential (primary) hypertension: Secondary | ICD-10-CM | POA: Diagnosis not present

## 2022-12-10 DIAGNOSIS — I2510 Atherosclerotic heart disease of native coronary artery without angina pectoris: Secondary | ICD-10-CM

## 2022-12-10 MED ORDER — AMLODIPINE BESYLATE 5 MG PO TABS: 5.0000 mg | ORAL_TABLET | Freq: Every day | ORAL | 3 refills | Status: DC

## 2022-12-10 NOTE — Patient Instructions (Signed)
Medication Instructions:  Your physician has recommended you make the following change in your medication:   REDUCE Amlodipine to 5 mg taking 1 daily  *If you need a refill on your cardiac medications before your next appointment, please call your pharmacy*   Lab Work: TODAY:  BMET, CBC, MAG, & PRO BNP  If you have labs (blood work) drawn today and your tests are completely normal, you will receive your results only by: MyChart Message (if you have MyChart) OR A paper copy in the mail If you have any lab test that is abnormal or we need to change your treatment, we will call you to review the results.   Testing/Procedures: None ordered   Follow-Up: At [facility], you and your health needs are our priority.  As part of our continuing mission to provide you with exceptional heart care, we have created designated Provider Care Teams.  These Care Teams include your primary Cardiologist (physician) and Advanced Practice Providers (APPs -  Physician Assistants and Nurse Practitioners) who all work together to provide you with the care you need, when you need it.  We recommend signing up for the patient portal called "MyChart".  Sign up information is provided on this After Visit Summary.  MyChart is used to connect with patients for Virtual Visits (Telemedicine).  Patients are able to view lab/test results, encounter notes, upcoming appointments, etc.  Non-urgent messages can be sent to your provider as well.   To learn more about what you can do with MyChart, go to [email or web address].    Your next appointment:   2 week(s)  Provider:   Perlie Gold, PA-C         Other Instructions

## 2022-12-11 LAB — CBC
Hematocrit: 23.8 % — ABNORMAL LOW (ref 34.0–46.6)
Hemoglobin: 8 g/dL — ABNORMAL LOW (ref 11.1–15.9)
MCH: 27.7 pg (ref 26.6–33.0)
MCHC: 33.6 g/dL (ref 31.5–35.7)
MCV: 82 fL (ref 79–97)
NRBC: 1 % — ABNORMAL HIGH (ref 0–0)
Platelets: 327 10*3/uL (ref 150–450)
RBC: 2.89 x10E6/uL — ABNORMAL LOW (ref 3.77–5.28)
RDW: 20 % — ABNORMAL HIGH (ref 11.7–15.4)
WBC: 6.3 10*3/uL (ref 3.4–10.8)

## 2022-12-11 LAB — BASIC METABOLIC PANEL
BUN/Creatinine Ratio: 20 (ref 12–28)
BUN: 31 mg/dL — ABNORMAL HIGH (ref 8–27)
CO2: 27 mmol/L (ref 20–29)
Calcium: 9.6 mg/dL (ref 8.7–10.3)
Chloride: 94 mmol/L — ABNORMAL LOW (ref 96–106)
Creatinine, Ser: 1.54 mg/dL — ABNORMAL HIGH (ref 0.57–1.00)
Glucose: 107 mg/dL — ABNORMAL HIGH (ref 70–99)
Potassium: 3.4 mmol/L — ABNORMAL LOW (ref 3.5–5.2)
Sodium: 137 mmol/L (ref 134–144)
eGFR: 35 mL/min/{1.73_m2} — ABNORMAL LOW (ref >59)

## 2022-12-11 LAB — MAGNESIUM: Magnesium: 1.8 mg/dL (ref 1.6–2.3)

## 2022-12-11 LAB — PRO B NATRIURETIC PEPTIDE: NT-Pro BNP: 1582 pg/mL — ABNORMAL HIGH (ref 0–738)

## 2022-12-12 ENCOUNTER — Other Ambulatory Visit (HOSPITAL_COMMUNITY): Payer: Self-pay | Admitting: Adult Health

## 2022-12-12 ENCOUNTER — Telehealth: Payer: Self-pay | Admitting: Physician Assistant

## 2022-12-12 DIAGNOSIS — R778 Other specified abnormalities of plasma proteins: Secondary | ICD-10-CM

## 2022-12-12 DIAGNOSIS — D649 Anemia, unspecified: Secondary | ICD-10-CM

## 2022-12-12 NOTE — Telephone Encounter (Signed)
Trixie Dredge a NP with [facility] is calling requesting to speak with Dana Beltran regarding the patient's future care. She states she is faxing over her last OV note with the pt as well as a virtual cardiology visit note, and echo results that Tessa did not have access to at her last visit. She would like a callback if not today sometime next week to discuss further. She states to ask to speak with her directly at the [facility] office and they will transfer the call to her. Please advise.

## 2022-12-15 ENCOUNTER — Other Ambulatory Visit: Payer: Self-pay | Admitting: Radiology

## 2022-12-15 DIAGNOSIS — D649 Anemia, unspecified: Secondary | ICD-10-CM

## 2022-12-16 ENCOUNTER — Ambulatory Visit (HOSPITAL_COMMUNITY): Payer: Medicare HMO | Attending: Cardiology

## 2022-12-16 DIAGNOSIS — R0602 Shortness of breath: Secondary | ICD-10-CM | POA: Insufficient documentation

## 2022-12-16 LAB — MYOCARDIAL PERFUSION IMAGING
LV dias vol: 46 mL (ref 46–106)
LV sys vol: 20 mL
Nuc Stress EF: 57 %
Peak HR: 104 {beats}/min
Rest HR: 67 {beats}/min
Rest Nuclear Isotope Dose: 10.5 mCi
SDS: 0
SRS: 0
SSS: 0
Stress Nuclear Isotope Dose: 31.6 mCi
TID: 1.03

## 2022-12-16 MED ORDER — TECHNETIUM TC 99M TETROFOSMIN IV KIT
31.6000 | PACK | Freq: Once | INTRAVENOUS | Status: AC | PRN
  Administered 2022-12-16: 31.6 via INTRAVENOUS

## 2022-12-16 MED ORDER — TECHNETIUM TC 99M TETROFOSMIN IV KIT
10.5000 | PACK | Freq: Once | INTRAVENOUS | Status: AC | PRN
  Administered 2022-12-16: 10.5 via INTRAVENOUS

## 2022-12-16 MED ORDER — REGADENOSON 0.4 MG/5ML IV SOLN
0.4000 mg | Freq: Once | INTRAVENOUS | Status: AC
  Administered 2022-12-16: 0.4 mg via INTRAVENOUS

## 2022-12-16 NOTE — Consult Note (Signed)
Chief Complaint: Patient was seen in consultation today for CT guided bone marrow biopsy  Referring Physician(s): Corbett,Ashley  Supervising Physician: Simonne Come  Patient Status: [facility] - Out-pt  History of Present Illness: Dana Beltran is a 77 y.o. 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 She is scheduled today for CT guided bone marrow biopsy for further evaluation.   Past Medical History:  Diagnosis Date   Cataract disorder type 14    Coronary artery disease    Hypertension    Leg edema, left    after door fell on her at work    MI, old     Past Surgical History:  Procedure Laterality Date   CARDIAC SURGERY     Stent placement x2     Allergies: Caffeine, Coconut (cocos nucifera), Orange oil, Pineapple, Prunus persica, Strawberry extract, Tomato, Dust mite extract, Other, Chocolate, Citrus, and Peach flavor  Medications: Prior to Admission medications   Medication Sig Start Date End Date Taking? Authorizing Provider  amLODipine (NORVASC) 5 MG tablet Take 1 tablet (5 mg total) by mouth daily. 12/10/22 03/10/23  Sharlene Dory, PA-C  ASPIRIN 81 PO Take 81 mg by mouth daily.    [provider]  atorvastatin (LIPITOR) 40 MG tablet Take 40 mg by mouth daily.    [provider]  benzonatate (TESSALON) 100 MG capsule Take 100 mg by mouth 3 (three) times daily as needed for cough. 11/06/22   [provider]  Biotin 10 MG TABS Take 1 tablet by mouth daily.    [provider]  Coenzyme Q10 (CO Q-10) 100 MG CAPS Take 1 capsule by mouth daily.    [provider]  Ginkgo Biloba 40 MG TABS Take 1 tablet by mouth daily.    [provider]  Glucosamine-Chondroit-Vit C-Mn (GLUCOSAMINE 1500 COMPLEX PO) Take 1 tablet by mouth daily.    [provider]  metFORMIN (GLUCOPHAGE-XR) 500 MG 24 hr tablet Take 500 mg by mouth 2 (two) times daily with a meal.    [provider]  metoprolol succinate (TOPROL-XL) 50 MG 24 hr tablet TAKE 1 TABLET BY MOUTH  DAILY 08/20/20   Yates Decamp, MD  Multiple Vitamins-Minerals (CENTRUM SILVER PO) Take 1 tablet by mouth daily.    [provider]  ondansetron (ZOFRAN-ODT) 8 MG disintegrating tablet Take 8 mg by mouth 3 (three) times daily as needed. 12/01/22   [provider]  valsartan-hydrochlorothiazide (DIOVAN-HCT) 320-12.5 MG tablet TAKE 1 TABLET BY MOUTH  DAILY 03/16/20   Yates Decamp, MD  Vitamin A 3 MG (10000 UT) TABS Take 10,000 Units by mouth daily.    [provider]     Family History  Problem Relation Age of Onset   High blood pressure Father    Arthritis Father     Social History   Socioeconomic History   Marital status: Divorced    Spouse name: Not on file   Number of children: 0   Years of education: Not on file   Highest education level: High school graduate  Occupational History   Not on file  Tobacco Use   Smoking status: Never   Smokeless tobacco: Never  Vaping Use   Vaping status: Never Used  Substance and Sexual Activity   Alcohol use: Never   Drug use: Never   Sexual  activity: Not on file  Other Topics Concern   Not on file  Social History Narrative   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    Social Determinants of Health   Financial Resource Strain: Medium Risk (09/02/2022)   Received from [facility]   Overall Financial Resource Strain (CARDIA)    Difficulty of Paying Living Expenses: Somewhat hard  Food Insecurity: No Food Insecurity (10/29/2022)   Hunger Vital Sign    Worried About Running Out of Food in the Last Year: Never true    Ran Out of Food in the Last Year: Never true  Transportation Needs: No Transportation Needs (10/29/2022)   PRAPARE - Administrator, Civil Service (Medical): No    Lack of  Transportation (Non-Medical): No  Recent Concern: Transportation Needs - Unmet Transportation Needs (09/02/2022)   Received from [facility] - Transportation    Lack of Transportation (Medical): Yes    Lack of Transportation (Non-Medical): Yes  Physical Activity: Not on file  Stress: Not on file  Social Connections: Unknown (08/25/2022)   Received from [facility]   Social Network    Social Network: Not on file     Review of Systems: denies fever, HA,CP,dyspnea, cough, abd/back pain,N/V or bleeding; she is anxious  Vital Signs: Vitals:   12/17/22 0908  BP: (!) 153/73  Pulse: 75  Resp: 18  Temp: 98.8 F (37.1 C)  SpO2: 100%      Code Status: FULL CODE  Advance Care Plan: no documents on file    Physical Exam: awake/alert; chest- CTA bilat; heart- RRR; abd-soft,+BS,NT; bilat LE edema 2-3+  Imaging: MYOCARDIAL PERFUSION IMAGING  Result Date: 12/16/2022   The study is normal. The study is low risk.   Left ventricular function is normal. Nuclear stress EF: 57%. The left ventricular ejection fraction is normal (55-65%). End diastolic cavity size is normal.   CT ABDOMEN PELVIS W CONTRAST  Result Date: 11/28/2022 CLINICAL DATA:  Acute abdominal pain. Nausea and vomiting. Constipation. EXAM: CT ABDOMEN AND PELVIS WITH CONTRAST TECHNIQUE: Multidetector CT imaging of the abdomen and pelvis was performed using the standard protocol following bolus administration of intravenous contrast. RADIATION DOSE REDUCTION: This exam was performed according to the departmental dose-optimization program which includes automated exposure control, adjustment of the mA and/or kV according to patient size and/or use of iterative reconstruction technique. CONTRAST:  60mL OMNIPAQUE IOHEXOL 350 MG/ML SOLN COMPARISON:  None Available. FINDINGS: Lower chest: Small pericardial effusion. Left anterior descending right coronary atherosclerosis and descending thoracic atherosclerosis. Mild  cardiomegaly. Hepatobiliary: Focal steatosis in segment 4 adjacent to the falciform ligament. Gallbladder unremarkable. No biliary dilatation. No significant findings. Pancreas: Unremarkable Spleen: Unremarkable Adrenals/Urinary Tract: Both adrenal glands appear normal. Three benign left renal cysts are present. Two of these have faint calcifications along the margins compatible with Bosniak category 2 cysts. No further imaging workup of these lesions is indicated. Stomach/Bowel: Mild sigmoid colon diverticulosis. Colonic stool burden is normal. Normal appendix. No dilated small bowel or significant abnormal bowel wall thickening identified. Proximal transverse duodenal diverticulum. Vascular/Lymphatic: Atherosclerosis is present, including aortoiliac atherosclerotic disease. Reproductive: Unremarkable Other: Nonspecific mild presacral edema as on image 68 series 7. Musculoskeletal: Bridging spurring of the right sacroiliac joint. Degenerative hip arthropathy bilaterally. IMPRESSION: 1. No specific acute findings are identified in the abdomen or pelvis to explain the patient's symptoms. 2. Mild sigmoid colon diverticulosis. 3. Nonspecific mild presacral edema. 4. Small pericardial effusion. 5. Mild cardiomegaly. 6. Aortic, systemic, and coronary atherosclerosis.  7. Degenerative hip arthropathy bilaterally. 8. Bridging spurring of the right sacroiliac joint. Aortic Atherosclerosis (ICD10-I70.0). Electronically Signed   By: Gaylyn Rong M.D.   On: 11/28/2022 17:38    Labs:  CBC: Recent Labs    10/29/22 2100 11/12/22 2058 11/28/22 1155 11/28/22 1411 12/10/22 1025  WBC 4.7 3.6* 6.2  --  6.3  HGB 9.3* 9.2* 8.2* 8.8* 8.0*  HCT 27.9* 27.3* 23.9* 26.0* 23.8*  PLT 132* 256 263  --  327    COAGS: No results for input(s): "INR", "APTT" in the last 8760 hours.  BMP: Recent Labs    10/29/22 1600 10/30/22 0741 11/12/22 2058 11/28/22 1155 11/28/22 1411 12/10/22 1025  NA 130* 132* 134* 140 140  137  K 4.2 3.3* 3.9 3.1* 4.0 3.4*  CL 94* 99 96* 101 103 94*  CO2 19* 20* 18* 24  --  27  GLUCOSE 86 73 80 117* 112* 107*  BUN 37* 29* 22 25* 31* 31*  CALCIUM 9.3 9.1 9.4 9.4  --  9.6  CREATININE 2.19* 1.65* 1.62* 1.39* 1.20* 1.54*  GFRNONAA 23* 32* 33* 40*  --   --     LIVER FUNCTION TESTS: Recent Labs    11/12/22 2058 11/28/22 1155  BILITOT 1.9* 1.7*  AST 20 16  ALT 16 13  ALKPHOS 68 61  PROT 6.6 6.8  ALBUMIN 3.3* 3.4*    TUMOR MARKERS: No results for input(s): "AFPTM", "CEA", "CA199", "CHROMGRNA" in the last 8760 hours.  Assessment and Plan: 77 y.o. 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 She is scheduled today for CT guided bone marrow biopsy for further evaluation.Risks and benefits of procedure was discussed with the patient  including, but not limited to bleeding, infection, damage to adjacent structures or low yield requiring additional tests.  All of the questions were answered and there is agreement to proceed.  Consent signed and in chart.    Thank you for this interesting consult.  I greatly enjoyed meeting Dana Beltran and look forward to participating in their care.  A copy of this report was sent to the requesting provider on this date.  Electronically Signed: D. Jeananne Rama, PA-C 12/16/2022, 1:36 PM   I spent a total of  20 minutes   in face to face in clinical consultation, greater than 50% of which was counseling/coordinating care for CT guided bone marrow biopsy

## 2022-12-16 NOTE — Telephone Encounter (Signed)
I will call the requesting office. I s/w Trixie Dredge, NP who called Friday 12/12/22. NP was calling about records that were faxed to our office.   I am going to send this to [place] and to Greta Doom, CMA to see if she might be able to help get the records.

## 2022-12-17 ENCOUNTER — Inpatient Hospital Stay (HOSPITAL_COMMUNITY)
Admission: RE | Admit: 2022-12-17 | Discharge: 2022-12-17 | Discharge status: Home / Self Care | Payer: Medicare HMO | Source: Ambulatory Visit | Attending: Adult Health

## 2022-12-17 ENCOUNTER — Ambulatory Visit (HOSPITAL_COMMUNITY)
Admission: RE | Admit: 2022-12-17 | Discharge: 2022-12-17 | Disposition: A | Discharge status: Home / Self Care | Payer: Medicare HMO | Source: Ambulatory Visit | Attending: Adult Health | Admitting: Adult Health

## 2022-12-17 ENCOUNTER — Other Ambulatory Visit: Payer: Self-pay

## 2022-12-17 ENCOUNTER — Encounter (HOSPITAL_COMMUNITY): Payer: Self-pay

## 2022-12-17 DIAGNOSIS — R778 Other specified abnormalities of plasma proteins: Secondary | ICD-10-CM | POA: Diagnosis not present

## 2022-12-17 DIAGNOSIS — D72819 Decreased white blood cell count, unspecified: Secondary | ICD-10-CM | POA: Diagnosis not present

## 2022-12-17 DIAGNOSIS — R609 Edema, unspecified: Secondary | ICD-10-CM | POA: Diagnosis not present

## 2022-12-17 DIAGNOSIS — I119 Hypertensive heart disease without heart failure: Secondary | ICD-10-CM | POA: Diagnosis not present

## 2022-12-17 DIAGNOSIS — D72822 Plasmacytosis: Secondary | ICD-10-CM | POA: Insufficient documentation

## 2022-12-17 DIAGNOSIS — I2510 Atherosclerotic heart disease of native coronary artery without angina pectoris: Secondary | ICD-10-CM | POA: Insufficient documentation

## 2022-12-17 DIAGNOSIS — Z1379 Encounter for other screening for genetic and chromosomal anomalies: Secondary | ICD-10-CM | POA: Diagnosis not present

## 2022-12-17 DIAGNOSIS — I3139 Other pericardial effusion (noninflammatory): Secondary | ICD-10-CM | POA: Insufficient documentation

## 2022-12-17 DIAGNOSIS — E785 Hyperlipidemia, unspecified: Secondary | ICD-10-CM | POA: Insufficient documentation

## 2022-12-17 DIAGNOSIS — D649 Anemia, unspecified: Secondary | ICD-10-CM | POA: Diagnosis present

## 2022-12-17 DIAGNOSIS — I252 Old myocardial infarction: Secondary | ICD-10-CM | POA: Diagnosis not present

## 2022-12-17 HISTORY — DX: Type 2 diabetes mellitus without complications: E11.9

## 2022-12-17 LAB — CBC WITH DIFFERENTIAL/PLATELET
Abs Immature Granulocytes: 0 K/uL (ref 0.00–0.07)
Basophils Absolute: 0 K/uL (ref 0.0–0.1)
Basophils Relative: 1 %
Eosinophils Absolute: 0.1 K/uL (ref 0.0–0.5)
Eosinophils Relative: 2 %
HCT: 23.2 % — ABNORMAL LOW (ref 36.0–46.0)
Hemoglobin: 7.8 g/dL — ABNORMAL LOW (ref 12.0–15.0)
Immature Granulocytes: 0 %
Lymphocytes Relative: 40 %
Lymphs Abs: 1.4 K/uL (ref 0.7–4.0)
MCH: 28.4 pg (ref 26.0–34.0)
MCHC: 33.6 g/dL (ref 30.0–36.0)
MCV: 84.4 fL (ref 80.0–100.0)
Monocytes Absolute: 0.5 K/uL (ref 0.1–1.0)
Monocytes Relative: 13 %
Neutro Abs: 1.6 K/uL — ABNORMAL LOW (ref 1.7–7.7)
Neutrophils Relative %: 44 %
Platelets: 202 K/uL (ref 150–400)
RBC: 2.75 MIL/uL — ABNORMAL LOW (ref 3.87–5.11)
RDW: 22.6 % — ABNORMAL HIGH (ref 11.5–15.5)
WBC: 3.5 K/uL — ABNORMAL LOW (ref 4.0–10.5)
nRBC: 0 % (ref 0.0–0.2)

## 2022-12-17 LAB — GLUCOSE, CAPILLARY: Glucose-Capillary: 90 mg/dL (ref 70–99)

## 2022-12-17 MED ORDER — MIDAZOLAM HCL 2 MG/2ML IJ SOLN
INTRAMUSCULAR | Status: AC | PRN
Start: 2022-12-17 — End: 2022-12-17
  Administered 2022-12-17: 1 mg via INTRAVENOUS

## 2022-12-17 MED ORDER — FLUMAZENIL 0.5 MG/5ML IV SOLN
INTRAVENOUS | Status: AC
  Filled 2022-12-17: qty 5

## 2022-12-17 MED ORDER — FENTANYL CITRATE (PF) 100 MCG/2ML IJ SOLN
INTRAMUSCULAR | Status: AC | PRN
  Administered 2022-12-17: 50 ug via INTRAVENOUS

## 2022-12-17 MED ORDER — FENTANYL CITRATE (PF) 100 MCG/2ML IJ SOLN
INTRAMUSCULAR | Status: AC | PRN
  Administered 2022-12-17: 25 ug via INTRAVENOUS

## 2022-12-17 MED ORDER — FENTANYL CITRATE (PF) 100 MCG/2ML IJ SOLN
INTRAMUSCULAR | Status: AC
  Filled 2022-12-17: qty 2

## 2022-12-17 MED ORDER — SODIUM CHLORIDE 0.9 % IV SOLN: INTRAVENOUS | Status: DC

## 2022-12-17 MED ORDER — MIDAZOLAM HCL 2 MG/2ML IJ SOLN
INTRAMUSCULAR | Status: AC
Start: 2022-12-17 — End: ?
  Filled 2022-12-17: qty 2

## 2022-12-17 MED ORDER — MIDAZOLAM HCL 2 MG/2ML IJ SOLN
INTRAMUSCULAR | Status: AC | PRN
Start: 2022-12-17 — End: 2022-12-17
  Administered 2022-12-17: .5 mg via INTRAVENOUS

## 2022-12-17 MED ORDER — NALOXONE HCL 0.4 MG/ML IJ SOLN
INTRAMUSCULAR | Status: AC
  Filled 2022-12-17: qty 1

## 2022-12-17 NOTE — Procedures (Signed)
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   Complications: None Immediate  EBL: None  Signed: Simonne Come Pager: [phone] 12/17/2022, 11:44 AM

## 2022-12-17 NOTE — Discharge Instructions (Signed)
Discharge Instructions:   Please call Interventional Radiology clinic [phone] with any questions or concerns.  You may remove your dressing and shower tomorrow.    Bone Marrow Aspiration and Bone Marrow Biopsy, Adult, Care After This sheet gives you information about how to care for yourself after your procedure. Your health care provider may also give you more specific instructions. If you have problems or questions, contact your health care provider. What can I expect after the procedure? After the procedure, it is common to have: Mild pain and tenderness. Swelling. Bruising. Follow these instructions at home: Puncture site care  Follow instructions from your health care provider about how to take care of the puncture site. Make sure you: Wash your hands with soap and water before and after you change your bandage (dressing). If soap and water are not available, use hand sanitizer. Change your dressing as told by your health care provider. Check your puncture site every day for signs of infection. Check for: More redness, swelling, or pain. Fluid or blood. Warmth. Pus or a bad smell. Activity Return to your normal activities as told by your health care provider. Ask your health care provider what activities are safe for you. Do not lift anything that is heavier than 10 lb (4.5 kg), or the limit that you are told, until your health care provider says that it is safe. Do not drive for 24 hours if you were given a sedative during your procedure. General instructions  Take over-the-counter and prescription medicines only as told by your health care provider. Do not take baths, swim, or use a hot tub until your health care provider approves. Ask your health care provider if you may take showers. You may only be allowed to take sponge baths. If directed, put ice on the affected area. To do this: Put ice in a plastic bag. Place a towel between your skin and the bag. Leave the ice  on for 20 minutes, 2-3 times a day. Keep all follow-up visits as told by your health care provider. This is important. Contact a health care provider if: Your pain is not controlled with medicine. You have a fever. You have more redness, swelling, or pain around the puncture site. You have fluid or blood coming from the puncture site. Your puncture site feels warm to the touch. You have pus or a bad smell coming from the puncture site. Summary After the procedure, it is common to have mild pain, tenderness, swelling, and bruising. Follow instructions from your health care provider about how to take care of the puncture site and what activities are safe for you. Take over-the-counter and prescription medicines only as told by your health care provider. Contact a health care provider if you have any signs of infection, such as fluid or blood coming from the puncture site. This information is not intended to replace advice given to you by your health care provider. Make sure you discuss any questions you have with your health care provider. Document Revised: 06/08/2018 Document Reviewed: 06/08/2018 Elsevier Patient Education  2023 Elsevier Inc.   Moderate Conscious Sedation, Adult, Care After This sheet gives you information about how to care for yourself after your procedure. Your health care provider may also give you more specific instructions. If you have problems or questions, contact your health care provider. What can I expect after the procedure? After the procedure, it is common to have: Sleepiness for several hours. Impaired judgment for several hours. Difficulty with balance. Vomiting if   you eat too soon. Follow these instructions at home: For the time period you were told by your health care provider: Rest. Do not participate in activities where you could fall or become injured. Do not drive or use machinery. Do not drink alcohol. Do not take sleeping pills or medicines that  cause drowsiness. Do not make important decisions or sign legal documents. Do not take care of children on your own. Eating and drinking  Follow the diet recommended by your health care provider. Drink enough fluid to keep your urine pale yellow. If you vomit: Drink water, juice, or soup when you can drink without vomiting. Make sure you have little or no nausea before eating solid foods. General instructions Take over-the-counter and prescription medicines only as told by your health care provider. Have a responsible adult stay with you for the time you are told. It is important to have someone help care for you until you are awake and alert. Do not smoke. Keep all follow-up visits as told by your health care provider. This is important. Contact a health care provider if: You are still sleepy or having trouble with balance after 24 hours. You feel light-headed. You keep feeling nauseous or you keep vomiting. You develop a rash. You have a fever. You have redness or swelling around the IV site. Get help right away if: You have trouble breathing. You have new-onset confusion at home. Summary After the procedure, it is common to feel sleepy, have impaired judgment, or feel nauseous if you eat too soon. Rest after you get home. Know the things you should not do after the procedure. Follow the diet recommended by your health care provider and drink enough fluid to keep your urine pale yellow. Get help right away if you have trouble breathing or new-onset confusion at home. This information is not intended to replace advice given to you by your health care provider. Make sure you discuss any questions you have with your health care provider. Document Revised: 05/20/2019 Document Reviewed: 12/16/2018 Elsevier Patient Education  2023 Elsevier Inc.  

## 2022-12-18 ENCOUNTER — Inpatient Hospital Stay: Payer: Medicare HMO

## 2022-12-18 ENCOUNTER — Inpatient Hospital Stay: Payer: Medicare HMO | Attending: Hematology | Admitting: Hematology

## 2022-12-18 VITALS — BP 144/81 | HR 101 | Temp 97.2°F | Resp 18 | Wt 145.9 lb

## 2022-12-18 DIAGNOSIS — R779 Abnormality of plasma protein, unspecified: Secondary | ICD-10-CM | POA: Diagnosis not present

## 2022-12-18 DIAGNOSIS — D649 Anemia, unspecified: Secondary | ICD-10-CM | POA: Insufficient documentation

## 2022-12-18 DIAGNOSIS — K5900 Constipation, unspecified: Secondary | ICD-10-CM | POA: Insufficient documentation

## 2022-12-18 DIAGNOSIS — N289 Disorder of kidney and ureter, unspecified: Secondary | ICD-10-CM | POA: Diagnosis not present

## 2022-12-18 DIAGNOSIS — D472 Monoclonal gammopathy: Secondary | ICD-10-CM

## 2022-12-18 LAB — CBC WITH DIFFERENTIAL (CANCER CENTER ONLY)
Abs Immature Granulocytes: 0.01 10*3/uL (ref 0.00–0.07)
Basophils Absolute: 0 10*3/uL (ref 0.0–0.1)
Basophils Relative: 1 %
Eosinophils Absolute: 0 10*3/uL (ref 0.0–0.5)
Eosinophils Relative: 1 %
HCT: 23 % — ABNORMAL LOW (ref 36.0–46.0)
Hemoglobin: 8 g/dL — ABNORMAL LOW (ref 12.0–15.0)
Immature Granulocytes: 0 %
Lymphocytes Relative: 32 %
Lymphs Abs: 0.9 10*3/uL (ref 0.7–4.0)
MCH: 28.6 pg (ref 26.0–34.0)
MCHC: 34.8 g/dL (ref 30.0–36.0)
MCV: 82.1 fL (ref 80.0–100.0)
Monocytes Absolute: 0.4 10*3/uL (ref 0.1–1.0)
Monocytes Relative: 14 %
Neutro Abs: 1.5 10*3/uL — ABNORMAL LOW (ref 1.7–7.7)
Neutrophils Relative %: 52 %
Platelet Count: 293 10*3/uL (ref 150–400)
RBC: 2.8 MIL/uL — ABNORMAL LOW (ref 3.87–5.11)
RDW: 22.1 % — ABNORMAL HIGH (ref 11.5–15.5)
WBC Count: 2.9 10*3/uL — ABNORMAL LOW (ref 4.0–10.5)
nRBC: 0 % (ref 0.0–0.2)

## 2022-12-18 LAB — IRON AND IRON BINDING CAPACITY (CC-WL,HP ONLY)
Iron: 57 ug/dL (ref 28–170)
Saturation Ratios: 27 % (ref 10.4–31.8)
TIBC: 211 ug/dL — ABNORMAL LOW (ref 250–450)
UIBC: 154 ug/dL (ref 148–442)

## 2022-12-18 LAB — FERRITIN: Ferritin: 585 ng/mL — ABNORMAL HIGH (ref 11–307)

## 2022-12-18 LAB — SAMPLE TO BLOOD BANK

## 2022-12-18 LAB — RETICULOCYTES
Immature Retic Fract: 14.8 % (ref 2.3–15.9)
RBC.: 2.73 MIL/uL — ABNORMAL LOW (ref 3.87–5.11)
Retic Count, Absolute: 76.4 10*3/uL (ref 19.0–186.0)
Retic Ct Pct: 2.8 % (ref 0.4–3.1)

## 2022-12-18 LAB — LACTATE DEHYDROGENASE: LDH: 229 U/L — ABNORMAL HIGH (ref 98–192)

## 2022-12-18 LAB — VITAMIN B12: Vitamin B-12: 457 pg/mL (ref 180–914)

## 2022-12-18 NOTE — Progress Notes (Signed)
HEMATOLOGY/ONCOLOGY CONSULTATION NOTE  Date of Service: 12/18/2022  Patient Care Team: Judd Gaudier, NP as PCP - General (Nurse Practitioner)  CHIEF COMPLAINTS/PURPOSE OF CONSULTATION:  progressive anemia, abnormal SPEP and normocytic anemia.  HISTORY OF PRESENTING ILLNESS:  Dana Beltran is a wonderful 77 y.o. female who has been referred to [place] by Elesa Massed, DO for evaluation and management of progressive anemia, abnormal SPEP and normocytic anemia.  Patient was seen at [facility] by Myrtis Ser, NP on 09/15/2022.   Recent lab workup showed IgG of 1935 with M spike of 0.9, elevated kappa chains of 70.9 with normal lambda light chains 24.1. Ratio 2.94. hgb 8.9 on 11/07/2022.   She had a bone marrow biopsy yesterday and the results are pending at this time.   She reports that she has had anemia for a while, more so as she has gotten older. She has not required blood transfusions in the past.  Patient complains of feeling cold constantly. Patient complains of weakness for at least 2 months. She complains of brain fog.   She complains of palpitations with minimal activity and SOB. Patient reports that her heart thickening is managed by cardiology. She has follow up with her cardiologist on Tuesday of next week.   Patient reports that she was intermittently fasting for some time and did not eat after 8 pm. She consequently lost 60s pounds in less than 6 months. She has discontinued intermittent fasting at this time. Patient has been taking ensure. Patient complains of taste issues due to medication she takes. Patient reports that she has allergies to oranges, mango, and chocolate, among other foods. She notes that she did not drink water today.  She complains of needing to take Zofran daily prior to eating and drinking to prevent nausea. This is a new symptom. She reports that she cannot eat full meals. She has no abdominal pain. She has not had a bowel movement for 2 weeks.  She just started taking laxatives yesterday. Patient notes that generally, if she consumes something hot, it does cause a bowel movement. She has not tried this recently. She does have Colase at home.  She has had leg swelling for years. Patient reports that years ago, a door fell on her left leg, and has had leg swelling for years. She has bilateral leg swelling L>R. She has not been on any water pills recently but was previously years ago.   She reports that her maternal first cousin did have cancer. She is unaware of any other cancer in the family. Patient has a fhx of sickle cell disease and thalassemia, including in her cousin and aunt. Patient does not have a hx of sickle cell disease or thalassemia.  Patient lives by herself in an independent living facility. Her neighbors generally provide meals for her.   Patient reports that a few months ago a doctor found a thyroid nodule on her neck. She reports that she has been putting wrinkle cream on her neck regularly and never noticed it.   Her last mammogram was in September and showed normal findings. There were findings of polyps in her colon a few years ago. Patient had a colonoscopy last year which showed no concerns. She did previously have an endoscopy.  No new bone pain in spine, shoulders, or hips. No other new symptoms. She denies any abdominal pain.   MEDICAL HISTORY:  Past Medical History:  Diagnosis Date   Cataract disorder type 14    Coronary artery  disease    Diabetes mellitus without complication (HCC)    Hypertension    Leg edema, left    after door fell on her at work    MI, old     SURGICAL HISTORY: Past Surgical History:  Procedure Laterality Date   CARDIAC SURGERY     Stent placement x2     SOCIAL HISTORY: Social History   Socioeconomic History   Marital status: Divorced    Spouse name: Not on file   Number of children: 0   Years of education: Not on file   Highest education level: High school graduate   Occupational History   Not on file  Tobacco Use   Smoking status: Never   Smokeless tobacco: Never  Vaping Use   Vaping status: Never Used  Substance and Sexual Activity   Alcohol use: Never   Drug use: Never   Sexual activity: Not on file  Other Topics Concern   Not on file  Social History Narrative   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    Social Determinants of Health   Financial Resource Strain: Medium Risk (09/02/2022)   Received from [facility]   Overall Financial Resource Strain (CARDIA)    Difficulty of Paying Living Expenses: Somewhat hard  Food Insecurity: No Food Insecurity (10/29/2022)   Hunger Vital Sign    Worried About Running Out of Food in the Last Year: Never true    Ran Out of Food in the Last Year: Never true  Transportation Needs: No Transportation Needs (10/29/2022)   PRAPARE - Administrator, Civil Service (Medical): No    Lack of Transportation (Non-Medical): No  Recent Concern: Transportation Needs - Unmet Transportation Needs (09/02/2022)   Received from [facility] - Transportation    Lack of Transportation (Medical): Yes    Lack of Transportation (Non-Medical): Yes  Physical Activity: Not on file  Stress: Not on file  Social Connections: Unknown (08/25/2022)   Received from [facility]   Social Network    Social Network: Not on file  Intimate Partner Violence: Not At Risk (10/29/2022)   Humiliation, Afraid, Rape, and Kick questionnaire    Fear of Current or Ex-Partner: No    Emotionally Abused: No    Physically Abused: No    Sexually Abused: No    FAMILY HISTORY: Family History  Problem Relation Age of Onset   High blood pressure Father    Arthritis Father     ALLERGIES:  is allergic to caffeine, coconut (cocos nucifera), orange oil, pineapple, prunus persica, strawberry extract, tomato, dust mite extract, other, chocolate, citrus, and peach flavor.   MEDICATIONS:  Current Outpatient Medications  Medication  Sig Dispense Refill   amLODipine (NORVASC) 5 MG tablet Take 1 tablet (5 mg total) by mouth daily. 90 tablet 3   ASPIRIN 81 PO Take 81 mg by mouth daily.     atorvastatin (LIPITOR) 40 MG tablet Take 40 mg by mouth daily.     benzonatate (TESSALON) 100 MG capsule Take 100 mg by mouth 3 (three) times daily as needed for cough.     Biotin 10 MG TABS Take 1 tablet by mouth daily.     Coenzyme Q10 (CO Q-10) 100 MG CAPS Take 1 capsule by mouth daily.     Ginkgo Biloba 40 MG TABS Take 1 tablet by mouth daily.     Glucosamine-Chondroit-Vit C-Mn (GLUCOSAMINE 1500 COMPLEX PO) Take 1 tablet by mouth daily.     metFORMIN (GLUCOPHAGE-XR) 500  MG 24 hr tablet Take 500 mg by mouth 2 (two) times daily with a meal.     metoprolol succinate (TOPROL-XL) 50 MG 24 hr tablet TAKE 1 TABLET BY MOUTH  DAILY 90 tablet 3   Multiple Vitamins-Minerals (CENTRUM SILVER PO) Take 1 tablet by mouth daily.     ondansetron (ZOFRAN-ODT) 8 MG disintegrating tablet Take 8 mg by mouth 3 (three) times daily as needed.     valsartan-hydrochlorothiazide (DIOVAN-HCT) 320-12.5 MG tablet TAKE 1 TABLET BY MOUTH  DAILY 90 tablet 3   Vitamin A 3 MG (10000 UT) TABS Take 10,000 Units by mouth daily.     No current facility-administered medications for this visit.    REVIEW OF SYSTEMS:    10 Point review of Systems was done is negative except as noted above.  PHYSICAL EXAMINATION: ECOG PERFORMANCE STATUS: {CHL ONC ECOG ZO:[id number]}  .There were no vitals filed for this visit. There were no vitals filed for this visit. .There is no height or weight on file to calculate BMI.  GENERAL:alert, in no acute distress and comfortable SKIN: no acute rashes, no significant lesions EYES: conjunctiva are pink and non-injected, sclera anicteric OROPHARYNX: MMM, no exudates, no oropharyngeal erythema or ulceration NECK: supple, no JVD LYMPH:  no palpable lymphadenopathy in the cervical, axillary or inguinal regions LUNGS: clear to auscultation  b/l with normal respiratory effort HEART: regular rate & rhythm ABDOMEN:  normoactive bowel sounds , non tender, not distended. Extremity: no pedal edema PSYCH: alert & oriented x 3 with fluent speech NEURO: no focal motor/sensory deficits  LABORATORY DATA:  I have reviewed the data as listed  .    Latest Ref Rng & Units 12/17/2022    9:30 AM 12/10/2022   10:25 AM 11/28/2022    2:11 PM  CBC  WBC 4.0 - 10.5 K/uL 3.5  6.3    Hemoglobin 12.0 - 15.0 g/dL 7.8  8.0  8.8   Hematocrit 36.0 - 46.0 % 23.2  23.8  26.0   Platelets 150 - 400 K/uL 202  327      .    Latest Ref Rng & Units 12/10/2022   10:25 AM 11/28/2022    2:11 PM 11/28/2022   11:55 AM  CMP  Glucose 70 - 99 mg/dL 811  914  782   BUN 8 - 27 mg/dL 31  31  25    Creatinine 0.57 - 1.00 mg/dL 9.56  2.13  0.86   Sodium 134 - 144 mmol/L 137  140  140   Potassium 3.5 - 5.2 mmol/L 3.4  4.0  3.1   Chloride 96 - 106 mmol/L 94  103  101   CO2 20 - 29 mmol/L 27   24   Calcium 8.7 - 10.3 mg/dL 9.6   9.4   Total Protein 6.5 - 8.1 g/dL   6.8   Total Bilirubin 0.3 - 1.2 mg/dL   1.7   Alkaline Phos 38 - 126 U/L   61   AST 15 - 41 U/L   16   ALT 0 - 44 U/L   13    Lab work from 09/05/2022:         RADIOGRAPHIC STUDIES: I have personally reviewed the radiological images as listed and agreed with the findings in the report. 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 Please perform CT-guided bone marrow biopsy for tissue diagnostic purposes. EXAM: CT-GUIDED BONE MARROW BIOPSY AND ASPIRATION MEDICATIONS: None ANESTHESIA/SEDATION: Moderate (  conscious) sedation was employed during this procedure as administered by the Interventional Radiology RN. A total of Versed 1.5 mg and Fentanyl 75 mcg was administered intravenously. Moderate Sedation Time: 10 minutes. The patient's level of consciousness and vital signs were monitored continuously by radiology nursing throughout  the procedure under my direct supervision. COMPLICATIONS: None immediate. PROCEDURE: Informed consent was obtained from the patient following an explanation of the procedure, risks, benefits and alternatives. The patient understands, agrees and consents for the procedure. All questions were addressed. A time out was performed prior to the initiation of the procedure. The patient was positioned prone and non-contrast localization CT was performed of the pelvis to demonstrate the iliac marrow spaces. The operative site was prepped and draped in the usual sterile fashion. Under sterile conditions and local anesthesia, a 22 gauge spinal needle was utilized for procedural planning. Next, an 11 gauge coaxial bone biopsy needle was advanced into the left iliac marrow space. Needle position was confirmed with CT imaging. Initially, a bone marrow aspiration was performed. Next, a bone marrow biopsy was obtained with the 11 gauge outer bone marrow device. The needle was removed and superficial hemostasis was obtained with manual compression. A dressing was applied. The patient tolerated the procedure well without immediate post procedural complication. IMPRESSION: Successful CT guided left iliac bone marrow aspiration and core biopsy. Electronically Signed   By: Simonne Come M.D.   On: 12/17/2022 12:03   MYOCARDIAL PERFUSION IMAGING  Result Date: 12/16/2022   The study is normal. The study is low risk.   Left ventricular function is normal. Nuclear stress EF: 57%. The left ventricular ejection fraction is normal (55-65%). End diastolic cavity size is normal.   CT ABDOMEN PELVIS W CONTRAST  Result Date: 11/28/2022 CLINICAL DATA:  Acute abdominal pain. Nausea and vomiting. Constipation. EXAM: CT ABDOMEN AND PELVIS WITH CONTRAST TECHNIQUE: Multidetector CT imaging of the abdomen and pelvis was performed using the standard protocol following bolus administration of intravenous contrast. RADIATION DOSE REDUCTION: This  exam was performed according to the departmental dose-optimization program which includes automated exposure control, adjustment of the mA and/or kV according to patient size and/or use of iterative reconstruction technique. CONTRAST:  60mL OMNIPAQUE IOHEXOL 350 MG/ML SOLN COMPARISON:  None Available. FINDINGS: Lower chest: Small pericardial effusion. Left anterior descending right coronary atherosclerosis and descending thoracic atherosclerosis. Mild cardiomegaly. Hepatobiliary: Focal steatosis in segment 4 adjacent to the falciform ligament. Gallbladder unremarkable. No biliary dilatation. No significant findings. Pancreas: Unremarkable Spleen: Unremarkable Adrenals/Urinary Tract: Both adrenal glands appear normal. Three benign left renal cysts are present. Two of these have faint calcifications along the margins compatible with Bosniak category 2 cysts. No further imaging workup of these lesions is indicated. Stomach/Bowel: Mild sigmoid colon diverticulosis. Colonic stool burden is normal. Normal appendix. No dilated small bowel or significant abnormal bowel wall thickening identified. Proximal transverse duodenal diverticulum. Vascular/Lymphatic: Atherosclerosis is present, including aortoiliac atherosclerotic disease. Reproductive: Unremarkable Other: Nonspecific mild presacral edema as on image 68 series 7. Musculoskeletal: Bridging spurring of the right sacroiliac joint. Degenerative hip arthropathy bilaterally. IMPRESSION: 1. No specific acute findings are identified in the abdomen or pelvis to explain the patient's symptoms. 2. Mild sigmoid colon diverticulosis. 3. Nonspecific mild presacral edema. 4. Small pericardial effusion. 5. Mild cardiomegaly. 6. Aortic, systemic, and coronary atherosclerosis. 7. Degenerative hip arthropathy bilaterally. 8. Bridging spurring of the right sacroiliac joint. Aortic Atherosclerosis (ICD10-I70.0). Electronically Signed   By: Gaylyn Rong M.D.   On: 11/28/2022 17:38  ASSESSMENT & PLAN:  77 y.o. female with:  progressive anemia  abnormal SPEP  normocytic anemia  PLAN:  -patient did have a bone marrow biopsy yesterday, 12/17/2022.  Will follow up on biopsy results -most recent CBC from 12/17/2022 showed WBC of 3.5K, hgb 7.8, and platelets 202K -her hgb was 8.9 on 11/07/2022 -RDW was normal and is now increasing. -recent CT scan showed no obvious findings.  -Patient has had progressive anemia for the last several months. Platelets are slightly lower. Will evaluate whether her abnormal protein is due to a plasma cell disorder.  -discussed goal to determine if the abnormal protein in her blood is the cause of her anemia or if it is an incidental finding. Discussed that a bone marrow biopsy would provide further evaluation.  -her major constipation likely is playing into her nausea -recommend taking 2 Senna at bedtime with stool softener to manage constipation. Also discussed option of magnesium citrate to flush out bowels.  -will order blood tests for further evaluation -answered all of paitent's questions in detail  FOLLOW-UP: Labs today RTC with Dr Candise Che in 7-10 days  The total time spent in the appointment was *** minutes* .  All of the patient's questions were answered with apparent satisfaction. The patient knows to call the clinic with any problems, questions or concerns.   Wyvonnia Lora MD MS AAHIVMS [facility] [place] Hematology/Oncology Physician [facility]  .*Total Encounter Time as defined by the Centers for Medicare and Medicaid Services includes, in addition to the face-to-face time of a patient visit (documented in the note above) non-face-to-face time: obtaining and reviewing outside history, ordering and reviewing medications, tests or procedures, care coordination (communications with other health care professionals or caregivers) and documentation in the medical record.    I,Mitra Faeizi,acting as a Neurosurgeon for Wyvonnia Lora,  MD.,have documented all relevant documentation on the behalf of Wyvonnia Lora, MD,as directed by  Wyvonnia Lora, MD while in the presence of Wyvonnia Lora, MD.  ***

## 2022-12-19 LAB — KAPPA/LAMBDA LIGHT CHAINS
Kappa free light chain: 70.5 mg/L — ABNORMAL HIGH (ref 3.3–19.4)
Kappa, lambda light chain ratio: 2.75 — ABNORMAL HIGH (ref 0.26–1.65)
Lambda free light chains: 25.6 mg/L (ref 5.7–26.3)

## 2022-12-19 LAB — HAPTOGLOBIN: Haptoglobin: 66 mg/dL (ref 42–346)

## 2022-12-19 LAB — SURGICAL PATHOLOGY

## 2022-12-19 LAB — BETA 2 MICROGLOBULIN, SERUM: Beta-2 Microglobulin: 2.1 mg/L (ref 0.6–2.4)

## 2022-12-22 LAB — MULTIPLE MYELOMA PANEL, SERUM
Albumin SerPl Elph-Mcnc: 3.5 g/dL (ref 2.9–4.4)
Albumin/Glob SerPl: 1.3 (ref 0.7–1.7)
Alpha 1: 0.2 g/dL (ref 0.0–0.4)
Alpha2 Glob SerPl Elph-Mcnc: 0.4 g/dL (ref 0.4–1.0)
B-Globulin SerPl Elph-Mcnc: 0.9 g/dL (ref 0.7–1.3)
Gamma Glob SerPl Elph-Mcnc: 1.4 g/dL (ref 0.4–1.8)
Globulin, Total: 2.9 g/dL (ref 2.2–3.9)
IgA: 290 mg/dL (ref 64–422)
IgG (Immunoglobin G), Serum: 1535 mg/dL (ref 586–1602)
IgM (Immunoglobulin M), Srm: 49 mg/dL (ref 26–217)
M Protein SerPl Elph-Mcnc: 0.7 g/dL — ABNORMAL HIGH
Total Protein ELP: 6.4 g/dL (ref 6.0–8.5)

## 2022-12-30 ENCOUNTER — Encounter: Payer: Self-pay | Admitting: Cardiology

## 2022-12-30 ENCOUNTER — Encounter (HOSPITAL_COMMUNITY): Payer: Self-pay

## 2022-12-30 ENCOUNTER — Ambulatory Visit: Payer: Medicare HMO | Attending: Cardiology | Admitting: Cardiology

## 2022-12-30 VITALS — BP 124/76 | HR 77 | Ht 62.0 in | Wt 143.0 lb

## 2022-12-30 DIAGNOSIS — I10 Essential (primary) hypertension: Secondary | ICD-10-CM

## 2022-12-30 DIAGNOSIS — I2510 Atherosclerotic heart disease of native coronary artery without angina pectoris: Secondary | ICD-10-CM | POA: Diagnosis not present

## 2022-12-30 DIAGNOSIS — E782 Mixed hyperlipidemia: Secondary | ICD-10-CM | POA: Diagnosis not present

## 2022-12-30 DIAGNOSIS — N179 Acute kidney failure, unspecified: Secondary | ICD-10-CM

## 2022-12-30 DIAGNOSIS — I3139 Other pericardial effusion (noninflammatory): Secondary | ICD-10-CM

## 2022-12-30 DIAGNOSIS — E785 Hyperlipidemia, unspecified: Secondary | ICD-10-CM

## 2022-12-30 DIAGNOSIS — I517 Cardiomegaly: Secondary | ICD-10-CM

## 2022-12-30 MED ORDER — ATORVASTATIN CALCIUM 80 MG PO TABS: 80.0000 mg | ORAL_TABLET | Freq: Every day | ORAL | 3 refills | Status: DC

## 2022-12-30 NOTE — Patient Instructions (Addendum)
Medication Instructions:  Your physician has recommended you make the following change in your medication:  INCREASE ATORVASTATIN TO 80 MG DAILY   *If you need a refill on your cardiac medications before your next appointment, please call your pharmacy*   Lab Work: TODAY: BMET  TO BE DONE IN 8 WEEKS: LIPIDS, LFTS   If you have labs (blood work) drawn today and your tests are completely normal, you will receive your results only by: MyChart Message (if you have MyChart) OR A paper copy in the mail If you have any lab test that is abnormal or we need to change your treatment, we will call you to review the results.   Testing/Procedures: YOUR PROVIDER RECOMMENDS THAT YOU HAVE AN CARDIAC MRI DONE AT [facility].     Follow-Up: At [facility], you and your health needs are our priority.  As part of our continuing mission to provide you with exceptional heart care, we have created designated Provider Care Teams.  These Care Teams include your primary Cardiologist (physician) and Advanced Practice Providers (APPs -  Physician Assistants and Nurse Practitioners) who all work together to provide you with the care you need, when you need it.  We recommend signing up for the patient portal called "MyChart".  Sign up information is provided on this After Visit Summary.  MyChart is used to connect with patients for Virtual Visits (Telemedicine).  Patients are able to view lab/test results, encounter notes, upcoming appointments, etc.  Non-urgent messages can be sent to your provider as well.   To learn more about what you can do with MyChart, go to [email or web address].    Your next appointment:   3 MONTHS  Provider:   DR. Rosemary Holms   Other Instructions YOU HAVE BE REFERRED TO THE HEART FAILURE CLINIC

## 2022-12-30 NOTE — Progress Notes (Signed)
Cardiology Office Note:   Date:  12/30/2022  ID:  Dana Beltran, DOB 25-Jul-1945, MRN [id number] PCP: Judd Gaudier, NP  [facility] Providers Cardiologist:  None    History of Present Illness:   Discussed the use of AI scribe software for clinical note transcription with the patient, who gave verbal consent to proceed.  History of Present Illness   The patient, a 77 year old with a history of hypertension, hyperlipidemia, and coronary artery disease status post PCI of LAD and RCA in October 2016, presented recently following an episode of syncope. Prior to the episode, the patient experienced dizziness and lightheadedness but denied any chest pain or shortness of breath. The patient also reported ongoing weakness, irregular bowel movements, and significant weight loss over the last several months, which was attributed to limited oral intake due to persistent nausea. (The patient tells me that she had been trying to lose weight and had been on an intermittent diet, which involved not eating after 8 pm. The patient believes this diet, and the resulting rapid weight loss, may have contributed to her recent health issues). Patient had also seen her PCP after this syncopal episode and a virtual cardiology provider ordered a Zio which patient has since mailed back. Results are not available to me today. Of note, she also had an echocardiogram in September that noted severe LVH (previously mild on 2022 echo).   Since the last visit two weeks ago, the patient reported feeling somewhat stronger, although still weak. The patient continues to experience nausea, requiring antiemetic medication with every other meal but says symptoms are better. The patient also reported a persistent issue of 'brain fog.' However, there were no further episodes of syncope. The patient's oral intake had improved slightly, which she believed contributed to her feeling better. The patient had been taking a reduced dose of  amlodipine (5mg ), as recommended at last cardiology visit.   Th/e patient reported no dizziness or lightheadedness and her blood pressure readings at home were around 120-130 systolic.   The patient reports ongoing shortness of breath with exertion, describing a rapid heartbeat akin to having run up three flights of stairs. This resulted in the patient moving slower and doing less. However, the patient is still able to lay flat without any breathing difficulties. She denies chest pain, palpitations.   The patient has longstanding lower extremity edema, which has not improved with the use of compression socks or diuretics in the past. The patient also reported a history of an accident that resulted in one leg being larger than the other.  The patient continues other evaluation of fatigue and anemia with hem/onc, has had MM workup and bone marrow biopsy concerning for MM. The patient also reported having nodules in her neck.      Studies Reviewed:    EKG:       12/16/22 Myocardial stress test    The study is normal. The study is low risk.   Left ventricular function is normal. Nuclear stress EF: 57%. The left ventricular ejection fraction is normal (55-65%). End diastolic cavity size is normal.   10/30/22 TTE  IMPRESSIONS     1. Left ventricular ejection fraction, by estimation, is 65 to 70%. The  left ventricle has normal function. The left ventricle has no regional  wall motion abnormalities. There is severe concentric left ventricular  hypertrophy. Left ventricular diastolic   parameters were normal.   2. Right ventricular systolic function is normal. The right ventricular  size is normal.  There is mildly elevated pulmonary artery systolic  pressure.   3. Left atrial size was mildly dilated.   4. Right atrial size was mildly dilated.   5. Moderate pericardial effusion. The pericardial effusion is  circumferential. There is no evidence of cardiac tamponade.   6. The mitral  valve is normal in structure. Trivial mitral valve  regurgitation. No evidence of mitral stenosis.   7. Tricuspid valve regurgitation is moderate.   8. There is focal calcification on the noncoronary cusp. The aortic valve  is tricuspid. Aortic valve regurgitation is not visualized. Aortic valve  sclerosis/calcification is present, without any evidence of aortic  stenosis.   9. The inferior vena cava is dilated in size with >50% respiratory  variability, suggesting right atrial pressure of 8 mmHg.   FINDINGS   Left Ventricle: Left ventricular ejection fraction, by estimation, is 65  to 70%. The left ventricle has normal function. The left ventricle has no  regional wall motion abnormalities. The left ventricular internal cavity  size was small. There is severe  concentric left ventricular hypertrophy. Left ventricular diastolic  parameters were normal.   Right Ventricle: The right ventricular size is normal. No increase in  right ventricular wall thickness. Right ventricular systolic function is  normal. There is mildly elevated pulmonary artery systolic pressure. The  tricuspid regurgitant velocity is 2.74   m/s, and with an assumed right atrial pressure of 8 mmHg, the estimated  right ventricular systolic pressure is 38.0 mmHg.   Left Atrium: Left atrial size was mildly dilated.   Right Atrium: Right atrial size was mildly dilated.   Pericardium: A moderately sized pericardial effusion is present. The  pericardial effusion is circumferential. There is no evidence of cardiac  tamponade.   Mitral Valve: The mitral valve is normal in structure. Trivial mitral  valve regurgitation. No evidence of mitral valve stenosis.   Tricuspid Valve: The tricuspid valve is normal in structure. Tricuspid  valve regurgitation is moderate . No evidence of tricuspid stenosis.   Aortic Valve: There is focal calcification on the noncoronary cusp. The  aortic valve is tricuspid. Aortic valve  regurgitation is not visualized.  Aortic valve sclerosis/calcification is present, without any evidence of  aortic stenosis. Aortic valve mean  gradient measures 6.0 mmHg. Aortic valve peak gradient measures 9.5 mmHg.  Aortic valve area, by VTI measures 2.05 cm.   Pulmonic Valve: The pulmonic valve was normal in structure. Pulmonic valve  regurgitation is not visualized. No evidence of pulmonic stenosis.   Aorta: The aortic root is normal in size and structure.   Venous: The inferior vena cava is dilated in size with greater than 50%  respiratory variability, suggesting right atrial pressure of 8 mmHg.   IAS/Shunts: No atrial level shunt detected by color flow Doppler.   Risk Assessment/Calculations:              Physical Exam:   VS:  BP 124/76   Pulse 77   Ht 5\' 2"  (1.575 m)   Wt 143 lb (64.9 kg)   SpO2 98%   BMI 26.16 kg/m    Wt Readings from Last 3 Encounters:  12/30/22 143 lb (64.9 kg)  12/18/22 145 lb 14.4 oz (66.2 kg)  12/17/22 141 lb 15.6 oz (64.4 kg)     Physical Exam Vitals reviewed.  Constitutional:      Appearance: Normal appearance.  HENT:     Head: Normocephalic.     Nose: Nose normal.  Eyes:  Pupils: Pupils are equal, round, and reactive to light.  Neck:     Comments: No elevation of JVP Cardiovascular:     Rate and Rhythm: Normal rate and regular rhythm.     Pulses: Normal pulses.     Heart sounds: Murmur (over apex) heard.     No friction rub. No gallop.  Pulmonary:     Effort: Pulmonary effort is normal.     Breath sounds: Normal breath sounds.  Musculoskeletal:     Right lower leg: No edema (non-pitting).     Left lower leg: No edema (non-pitting).  Skin:    General: Skin is warm and dry.     Capillary Refill: Capillary refill takes less than 2 seconds.  Neurological:     General: No focal deficit present.     Mental Status: She is alert and oriented to person, place, and time.  Psychiatric:        Mood and Affect: Mood normal.         Behavior: Behavior normal.        Thought Content: Thought content normal.        Judgment: Judgment normal.     ASSESSMENT AND PLAN:     Assessment and Plan    Severe Left Ventricular Hypertrophy Progression from mild to severe LVH on recent echocardiogram. Concern for AL amyloidosis based on lab abnormalities including bone marrow biopsy as well as kappa/lambda light chains with hem/onc as well as echocardiogram findings. Although pro-BNP in labs following last OV was notably elevated, patient does not appear significantly volume overloaded on physical exam. Peripheral edema is non-pitting and no JVP elevation noted.  - Order cardiac MRI - Refer to heart failure clinic - Would consider brief course of Lasix if today's BMP shows stable renal function  Syncope Recent syncope with dizziness and lightheadedness, likely orthostatic and dehydration-related. No recurrent symptoms. Blood pressure well-controlled. Patient prefers current medications and home blood pressure monitoring. - Monitor blood pressure - Request Zio patch results  Lower Extremity Edema Chronic non-pitting edema, likely due to calcium channel blocker use and possible lymphedema. No significant improvement with compression socks. Reducing amlodipine dose may help. - Monitor edema with reduced amlodipine dose - Recheck kidney function  Hyperlipidemia LDL of 114 in September, above goal of <70 for coronary artery disease. Discussed increasing atorvastatin to 80 mg daily to manage cholesterol and reduce cardiovascular risk. - Increase atorvastatin to 80 mg daily - Repeat lipid panel and LFTs in 8 weeks  CAD Patient s/p PCI of LAD and RCA in October 2016. No recent chest pain. 12/16/22 stress test ordered by third party cardiologist (affiliated with patient's PCP?) was normal/low risk. - Continue ASA 81mg  daily - Increase atorvastatin to 80mg  as above.   Hypertension Blood pressure is well controlled today.  -  Continue Amlodipine 5mg , Metoprolol Succinate 50mg , Valsartan/hydrochlorothiazide 320-12.5mg .   CKD Creatinine 1.54 on BMP 2 weeks ago. Near recently baseline of 1.2-1.65.  - Repeat BMP. Will forward results to PCP.   Pericardial effusion September echocardiogram with moderate concentric effusion, no evidence of tamponade. Small per CT. 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  Nausea/vomiting Per patient, improved from 2 weeks ago. Has PCP appointment today.   Follow-up - Follow-up with primary care provider today - Follow-up labs in 2 months after cholesterol medication change.  Signed, Perlie Gold, PA-C

## 2022-12-31 ENCOUNTER — Encounter (HOSPITAL_COMMUNITY): Payer: Self-pay | Admitting: Hematology

## 2022-12-31 LAB — BASIC METABOLIC PANEL
BUN/Creatinine Ratio: 16 (ref 12–28)
BUN: 15 mg/dL (ref 8–27)
CO2: 25 mmol/L (ref 20–29)
Calcium: 9.5 mg/dL (ref 8.7–10.3)
Chloride: 100 mmol/L (ref 96–106)
Creatinine, Ser: 0.95 mg/dL (ref 0.57–1.00)
Glucose: 96 mg/dL (ref 70–99)
Potassium: 4.2 mmol/L (ref 3.5–5.2)
Sodium: 136 mmol/L (ref 134–144)
eGFR: 62 mL/min/{1.73_m2} (ref >59)

## 2023-01-13 ENCOUNTER — Inpatient Hospital Stay: Payer: Medicare HMO

## 2023-01-13 ENCOUNTER — Inpatient Hospital Stay: Payer: Medicare HMO | Attending: Hematology | Admitting: Hematology

## 2023-01-13 VITALS — BP 155/76 | HR 95 | Temp 97.0°F | Resp 18 | Wt 144.2 lb

## 2023-01-13 DIAGNOSIS — D472 Monoclonal gammopathy: Secondary | ICD-10-CM | POA: Diagnosis not present

## 2023-01-13 DIAGNOSIS — D649 Anemia, unspecified: Secondary | ICD-10-CM | POA: Insufficient documentation

## 2023-01-13 DIAGNOSIS — R002 Palpitations: Secondary | ICD-10-CM | POA: Insufficient documentation

## 2023-01-13 DIAGNOSIS — R779 Abnormality of plasma protein, unspecified: Secondary | ICD-10-CM | POA: Insufficient documentation

## 2023-01-13 DIAGNOSIS — E041 Nontoxic single thyroid nodule: Secondary | ICD-10-CM | POA: Diagnosis not present

## 2023-01-13 DIAGNOSIS — R0602 Shortness of breath: Secondary | ICD-10-CM | POA: Insufficient documentation

## 2023-01-13 DIAGNOSIS — N289 Disorder of kidney and ureter, unspecified: Secondary | ICD-10-CM | POA: Diagnosis not present

## 2023-01-13 DIAGNOSIS — G479 Sleep disorder, unspecified: Secondary | ICD-10-CM | POA: Diagnosis not present

## 2023-01-13 DIAGNOSIS — Z860100 Personal history of colon polyps, unspecified: Secondary | ICD-10-CM | POA: Diagnosis not present

## 2023-01-13 LAB — SEDIMENTATION RATE: Sed Rate: 6 mm/h (ref 0–22)

## 2023-01-13 LAB — CBC WITH DIFFERENTIAL (CANCER CENTER ONLY)
Abs Immature Granulocytes: 0.01 10*3/uL (ref 0.00–0.07)
Basophils Absolute: 0 10*3/uL (ref 0.0–0.1)
Basophils Relative: 1 %
Eosinophils Absolute: 0 10*3/uL (ref 0.0–0.5)
Eosinophils Relative: 1 %
HCT: 24.4 % — ABNORMAL LOW (ref 36.0–46.0)
Hemoglobin: 8.4 g/dL — ABNORMAL LOW (ref 12.0–15.0)
Immature Granulocytes: 0 %
Lymphocytes Relative: 33 %
Lymphs Abs: 1.5 10*3/uL (ref 0.7–4.0)
MCH: 29.8 pg (ref 26.0–34.0)
MCHC: 34.4 g/dL (ref 30.0–36.0)
MCV: 86.5 fL (ref 80.0–100.0)
Monocytes Absolute: 0.5 10*3/uL (ref 0.1–1.0)
Monocytes Relative: 12 %
Neutro Abs: 2.5 10*3/uL (ref 1.7–7.7)
Neutrophils Relative %: 53 %
Platelet Count: 308 10*3/uL (ref 150–400)
RBC: 2.82 MIL/uL — ABNORMAL LOW (ref 3.87–5.11)
RDW: 19.2 % — ABNORMAL HIGH (ref 11.5–15.5)
WBC Count: 4.6 10*3/uL (ref 4.0–10.5)
nRBC: 0 % (ref 0.0–0.2)

## 2023-01-13 LAB — SAMPLE TO BLOOD BANK

## 2023-01-13 LAB — DIRECT ANTIGLOBULIN TEST (NOT AT ARMC)
DAT, IgG: NEGATIVE
DAT, complement: NEGATIVE

## 2023-01-13 LAB — T4, FREE: Free T4: 1.5 ng/dL — ABNORMAL HIGH (ref 0.61–1.12)

## 2023-01-13 LAB — LACTATE DEHYDROGENASE: LDH: 176 U/L (ref 98–192)

## 2023-01-13 NOTE — Progress Notes (Signed)
HEMATOLOGY/ONCOLOGY CLINIC NOTE  Date of Service: 01/13/2023  Patient Care Team: Judd Gaudier, NP as PCP - General (Nurse Practitioner)  CHIEF COMPLAINTS/PURPOSE OF CONSULTATION:  progressive anemia, abnormal SPEP and normocytic anemia.  HISTORY OF PRESENTING ILLNESS:   Dana Beltran is a wonderful 77 y.o. female who has been referred to [place] by Elesa Massed, DO for evaluation and management of progressive anemia, abnormal SPEP and normocytic anemia.  Patient was seen at [facility] by Myrtis Ser, NP on 09/15/2022.   Recent lab workup showed IgG of 1935 with M spike of 0.9, elevated kappa chains of 70.9 with normal lambda light chains 24.1. Ratio 2.94. hgb 8.9 on 11/07/2022.   She had a bone marrow biopsy yesterday and the results are pending at this time.   She reports that she has had anemia for a while, more so as she has gotten older. She has not required blood transfusions in the past.  Patient complains of feeling cold constantly. Patient complains of weakness for at least 2 months. She complains of brain fog.   She complains of palpitations with minimal activity and SOB. Patient reports that her heart thickening is managed by cardiology. She has follow up with her cardiologist on Tuesday of next week.   Patient reports that she was intermittently fasting for some time and did not eat after 8 pm. She consequently lost 60s pounds in less than 6 months. She has discontinued intermittent fasting at this time. Patient has been taking ensure. Patient complains of taste issues due to medication she takes. Patient reports that she has allergies to oranges, mango, and chocolate, among other foods. She notes that she did not drink water today.  She complains of needing to take Zofran daily prior to eating and drinking to prevent nausea. This is a new symptom. She reports that she cannot eat full meals. She has no abdominal pain. She has not had a bowel movement for 2 weeks. She  just started taking laxatives yesterday. Patient notes that generally, if she consumes something hot, it does cause a bowel movement. She has not tried this recently. She does have Colase at home.  She has had leg swelling for years. Patient reports that years ago, a door fell on her left leg, and has had leg swelling for years. She has bilateral leg swelling L>R. She has not been on any water pills recently but was previously years ago.   She reports that her maternal first cousin did have cancer. She is unaware of any other cancer in the family. Patient has a fhx of sickle cell disease and thalassemia, including in her cousin and aunt. Patient does not have a hx of sickle cell disease or thalassemia.  Patient lives by herself in an independent living facility. Her neighbors generally provide meals for her.   Patient reports that a few months ago a doctor found a thyroid nodule on her neck. She reports that she has been putting wrinkle cream on her neck regularly and never noticed it.   Her last mammogram was in September and showed normal findings. There were findings of polyps in her colon a few years ago. Patient had a colonoscopy last year which showed no concerns. She did previously have an endoscopy.  No new bone pain in spine, shoulders, or hips. No other new symptoms. She denies any abdominal pain.   INTERVAL HISTORY: Dana Beltran is a wonderful 77 y.o. female who is here for continued evaluation and management of  progressive anemia, abnormal SPEP and normocytic anemia. Patient was initially seen by me on 12/18/2022.  Patient notes she has been doing well overall since our last visit. She notes that her energy has improved since our last visit, and is around 50% from her baseline. She notes that her energy level today has been the best in past couple of weeks.   Patient has family history of sickle cell disease. She denies of getting tested or diagnosed with Sickle cell disease. She denies  family history of any auto-immune disease.   She denies any new infection issues, fever, chills, night sweats, unexpected weight loss, back pain, chest pain, or abdominal pain. She denies any joint pain, joint swelling, or bone pain. She does reports bilateral ankle swelling.   She denies any abnormal bleeding, black stool, blood in stool, nor hematuria. She notes that her urine color is light brown, like "caramel" colored, only in the morning. Her urine color is back to normal through the day.   Patient complains of two small bumps, one on each side of her hands. She first noticed this small bumps around 1-2 weeks ago.   She has been taking Zinc supplement, which she started around 2-3 weeks ago.   She notes that her appetite has decreased, but denies weight loss. She also complains of sleep disturbance and mild insomnia. Patient reports slow heart rates when she is laying down.    MEDICAL HISTORY:  Past Medical History:  Diagnosis Date   Cataract disorder type 14    Coronary artery disease    Diabetes mellitus without complication (HCC)    Hypertension    Leg edema, left    after door fell on her at work    MI, old     SURGICAL HISTORY: Past Surgical History:  Procedure Laterality Date   CARDIAC SURGERY     Stent placement x2     SOCIAL HISTORY: Social History   Socioeconomic History   Marital status: Divorced    Spouse name: Not on file   Number of children: 0   Years of education: Not on file   Highest education level: High school graduate  Occupational History   Not on file  Tobacco Use   Smoking status: Never   Smokeless tobacco: Never  Vaping Use   Vaping status: Never Used  Substance and Sexual Activity   Alcohol use: Never   Drug use: Never   Sexual activity: Not on file  Other Topics Concern   Not on file  Social History Narrative   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    Social Determinants of Health   Financial Resource Strain: Medium Risk  (09/02/2022)   Received from [facility]   Overall Financial Resource Strain (CARDIA)    Difficulty of Paying Living Expenses: Somewhat hard  Food Insecurity: No Food Insecurity (10/29/2022)   Hunger Vital Sign    Worried About Running Out of Food in the Last Year: Never true    Ran Out of Food in the Last Year: Never true  Transportation Needs: No Transportation Needs (10/29/2022)   PRAPARE - Administrator, Civil Service (Medical): No    Lack of Transportation (Non-Medical): No  Recent Concern: Transportation Needs - Unmet Transportation Needs (09/02/2022)   Received from [facility] - Transportation    Lack of Transportation (Medical): Yes    Lack of Transportation (Non-Medical): Yes  Physical Activity: Not on file  Stress: Not on file  Social Connections: Unknown (  08/25/2022)   Received from [facility]   Social Network    Social Network: Not on file  Intimate Partner Violence: Not At Risk (10/29/2022)   Humiliation, Afraid, Rape, and Kick questionnaire    Fear of Current or Ex-Partner: No    Emotionally Abused: No    Physically Abused: No    Sexually Abused: No    FAMILY HISTORY: Family History  Problem Relation Age of Onset   High blood pressure Father    Arthritis Father     ALLERGIES:  is allergic to caffeine, coconut (cocos nucifera), orange oil, pineapple, prunus persica, strawberry extract, tomato, chocolate, citrus, dust mite extract, flavoring agent, other, and peach flavoring agent (non-screening).   MEDICATIONS:  Current Outpatient Medications  Medication Sig Dispense Refill   amLODipine (NORVASC) 5 MG tablet Take 5 mg by mouth daily.     ASPIRIN 81 PO Take 81 mg by mouth daily.     atorvastatin (LIPITOR) 80 MG tablet Take 1 tablet (80 mg total) by mouth daily. 90 tablet 3   benzonatate (TESSALON) 100 MG capsule Take 100 mg by mouth 3 (three) times daily as needed for cough.     metFORMIN (GLUCOPHAGE-XR) 500 MG 24 hr tablet Take 500  mg by mouth 2 (two) times daily with a meal.     metoprolol succinate (TOPROL-XL) 50 MG 24 hr tablet TAKE 1 TABLET BY MOUTH  DAILY 90 tablet 3   ondansetron (ZOFRAN-ODT) 8 MG disintegrating tablet Take 8 mg by mouth 3 (three) times daily as needed.     valsartan-hydrochlorothiazide (DIOVAN-HCT) 320-12.5 MG tablet TAKE 1 TABLET BY MOUTH  DAILY 90 tablet 3   No current facility-administered medications for this visit.    REVIEW OF SYSTEMS:    10 Point review of Systems was done is negative except as noted above.  PHYSICAL EXAMINATION: ECOG PERFORMANCE STATUS: 1 - Symptomatic but completely ambulatory  . Vitals:   01/13/23 1430  BP: (!) 155/76  Pulse: 95  Resp: 18  Temp: (!) 97 F (36.1 C)  SpO2: 100%    Filed Weights   01/13/23 1430  Weight: 144 lb 3.2 oz (65.4 kg)    .Body mass index is 26.37 kg/m.  GENERAL:alert, in no acute distress and comfortable SKIN: no acute rashes, no significant lesions EYES: conjunctiva are pink and non-injected, sclera anicteric OROPHARYNX: MMM, no exudates, no oropharyngeal erythema or ulceration NECK: supple, no JVD LYMPH:  no palpable lymphadenopathy in the cervical, axillary or inguinal regions LUNGS: clear to auscultation b/l with normal respiratory effort HEART: regular rate & rhythm ABDOMEN:  normoactive bowel sounds , non tender, not distended. Extremity: no pedal edema PSYCH: alert & oriented x 3 with fluent speech NEURO: no focal motor/sensory deficits  LABORATORY DATA:  I have reviewed the data as listed  .    Latest Ref Rng & Units 01/13/2023    3:21 PM 12/18/2022   11:02 AM 12/17/2022    9:30 AM  CBC  WBC 4.0 - 10.5 K/uL 4.6  2.9  3.5   Hemoglobin 12.0 - 15.0 g/dL 8.4  8.0  7.8   Hematocrit 36.0 - 46.0 % 24.4  23.0  23.2   Platelets 150 - 400 K/uL 308  293  202     .    Latest Ref Rng & Units 12/30/2022   10:54 AM 12/10/2022   10:25 AM 11/28/2022    2:11 PM  CMP  Glucose 70 - 99 mg/dL 96  161  096  BUN 8 -  27 mg/dL 15  31  31    Creatinine 0.57 - 1.00 mg/dL 9.56  2.13  0.86   Sodium 134 - 144 mmol/L 136  137  140   Potassium 3.5 - 5.2 mmol/L 4.2  3.4  4.0   Chloride 96 - 106 mmol/L 100  94  103   CO2 20 - 29 mmol/L 25  27    Calcium 8.7 - 10.3 mg/dL 9.5  9.6     Lab work from 09/05/2022:         RADIOGRAPHIC STUDIES: I have personally reviewed the radiological images as listed and agreed with the findings in the report. 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 Please perform CT-guided bone marrow biopsy for tissue diagnostic purposes. EXAM: CT-GUIDED BONE MARROW BIOPSY AND ASPIRATION MEDICATIONS: None ANESTHESIA/SEDATION: Moderate (conscious) sedation was employed during this procedure as administered by the Interventional Radiology RN. A total of Versed 1.5 mg and Fentanyl 75 mcg was administered intravenously. Moderate Sedation Time: 10 minutes. The patient's level of consciousness and vital signs were monitored continuously by radiology nursing throughout the procedure under my direct supervision. COMPLICATIONS: None immediate. PROCEDURE: Informed consent was obtained from the patient following an explanation of the procedure, risks, benefits and alternatives. The patient understands, agrees and consents for the procedure. All questions were addressed. A time out was performed prior to the initiation of the procedure. The patient was positioned prone and non-contrast localization CT was performed of the pelvis to demonstrate the iliac marrow spaces. The operative site was prepped and draped in the usual sterile fashion. Under sterile conditions and local anesthesia, a 22 gauge spinal needle was utilized for procedural planning. Next, an 11 gauge coaxial bone biopsy needle was advanced into the left iliac marrow space. Needle position was confirmed with CT imaging. Initially, a bone marrow aspiration was performed. Next, a bone  marrow biopsy was obtained with the 11 gauge outer bone marrow device. The needle was removed and superficial hemostasis was obtained with manual compression. A dressing was applied. The patient tolerated the procedure well without immediate post procedural complication. IMPRESSION: Successful CT guided left iliac bone marrow aspiration and core biopsy. Electronically Signed   By: Simonne Come M.D.   On: 12/17/2022 12:03   MYOCARDIAL PERFUSION IMAGING  Result Date: 12/16/2022   The study is normal. The study is low risk.   Left ventricular function is normal. Nuclear stress EF: 57%. The left ventricular ejection fraction is normal (55-65%). End diastolic cavity size is normal.    ASSESSMENT & PLAN:   77 y.o. female with:  Progressive normocytic Anemia abnormal SPEP  3. Renal insufficiency  PLAN: -Discussed lab results from 12/18/2022 in detail with the patient. CBC shows low WBC of 2.9 K, low hemoglobin of 8.0 g/dL with hematocrit of 57.8%. CMP from 02/28/2022 was stable. Ferritin level elevated at 585 with iron saturation of 57% LDH level elevated at 229. Reticulocytes shows low RBC of 2.73. Haptoglobin of 66. Vitamin B-12 level of 457. -Discussed Multiple myeloma panel results from 12/18/2022. Showed elevated M-protein of 0.7. IgG monoclonal protein with kappa light chain specificity.  -Discussed Kappa/Lambda light chain results from 12/18/2022. Showed elevated kappa light chain of 70.5 mg/L with elevated Kappa/Lambda light chain ratio of 2.75.  -Discussed with the patient that labs do not show plasma disorder. 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  -Educated the patient about MGUS . -Discussed that the next  plan is to get additional lab workup after this visit. We would test the patient for Paroxysmal nocturnal hemoglobinuria (PNH). Also, needs to check if patient has sickle cell disease or trait. Patient agrees.   -patient will have labs today.   . Orders Placed This Encounter   Procedures   CBC with Differential (Cancer Center Only)    Standing Status:   Future    Number of Occurrences:   1    Expected Date:   01/13/2023    Expiration Date:   01/13/2024   Lactate dehydrogenase    Standing Status:   Future    Number of Occurrences:   1    Expected Date:   01/13/2023    Expiration Date:   01/13/2024   Haptoglobin    Standing Status:   Future    Number of Occurrences:   1    Expected Date:   01/13/2023    Expiration Date:   01/13/2024   PNH Profile (-High Sensitivity)    Standing Status:   Future    Number of Occurrences:   1    Expected Date:   01/13/2023    Expiration Date:   01/13/2024   Hgb Fractionation Cascade    Standing Status:   Future    Number of Occurrences:   1    Expected Date:   01/13/2023    Expiration Date:   01/13/2024   Sedimentation rate    Standing Status:   Future    Number of Occurrences:   1    Expected Date:   01/13/2023    Expiration Date:   01/13/2024   TSH    Standing Status:   Future    Number of Occurrences:   1    Expected Date:   01/13/2023    Expiration Date:   01/13/2024   T4, free    Standing Status:   Future    Number of Occurrences:   1    Expected Date:   01/13/2023    Expiration Date:   01/13/2024   Sample to Blood Bank    Standing Status:   Future    Number of Occurrences:   1    Expected Date:   01/13/2023    Expiration Date:   01/13/2024   Direct antiglobulin test (not at [facility])    Standing Status:   Future    Number of Occurrences:   1    Expected Date:   01/13/2023    Expiration Date:   01/13/2024    FOLLOW-UP: Labs today  The total time spent in the appointment was 25 minutes* .  All of the patient's questions were answered with apparent satisfaction. The patient knows to call the clinic with any problems, questions or concerns.   Wyvonnia Lora MD MS AAHIVMS [facility] Hematology/Oncology Physician [facility]  .*Total Encounter Time as defined by the Centers for Medicare  and Medicaid Services includes, in addition to the face-to-face time of a patient visit (documented in the note above) non-face-to-face time: obtaining and reviewing outside history, ordering and reviewing medications, tests or procedures, care coordination (communications with other health care professionals or caregivers) and documentation in the medical record.   I,Param Shah,acting as a Neurosurgeon for Wyvonnia Lora, MD.,have documented all relevant documentation on the behalf of Wyvonnia Lora, MD,as directed by  Wyvonnia Lora, MD while in the presence of Wyvonnia Lora, MD.  .I have reviewed the above documentation for accuracy and completeness, and I agree  with the above. Johney Maine MD   ADDENDUM  Component     Latest Ref Rng 01/13/2023  WBC     4.0 - 10.5 K/uL 4.6   RBC     3.87 - 5.11 MIL/uL 2.82 (L)   Hemoglobin     12.0 - 15.0 g/dL 8.4 (L)   HCT     16.1 - 46.0 % 24.4 (L)   MCV     80.0 - 100.0 fL 86.5   MCH     26.0 - 34.0 pg 29.8   MCHC     30.0 - 36.0 g/dL 09.6   RDW     04.5 - 40.9 % 19.2 (H)   Platelets     150 - 400 K/uL 308   nRBC     0.0 - 0.2 % 0.0   Neutrophils     % 53   NEUT#     1.7 - 7.7 K/uL 2.5   Lymphocytes     % 33   Lymphs Abs     0.7 - 4.0 K/uL 1.5   Monocytes Relative     % 12   Monocyte #     0.1 - 1.0 K/uL 0.5   Eosinophil     % 1   Eosinophils Absolute     0.0 - 0.5 K/uL 0.0   Basophil     % 1   Basophils Absolute     0.0 - 0.1 K/uL 0.0   Immature Granulocytes     % 0   Abs Immature Granulocytes     0.00 - 0.07 K/uL 0.01   HGB F     0.0 - 2.0 % 0.0   Hgb A     96.4 - 98.8 % 97.7   Hgb A2     1.8 - 3.2 % 2.3   HGB S     0.0 % 0.0   Interpretation, Hgb Fract Comment   DAT, complement NEG   DAT, IgG NEG.   T4,Free(Direct)     0.61 - 1.12 ng/dL 8.11 (H)   TSH     9.147 - 4.500 uIU/mL 0.997   Sed Rate     0 - 22 mm/hr 6   Interpretation Comment   Haptoglobin     42 - 346 mg/dL 829   LDH     98 - 562 U/L 176      Legend: (L) Low (H) High

## 2023-01-14 LAB — TSH: TSH: 0.997 u[IU]/mL (ref 0.350–4.500)

## 2023-01-15 LAB — HGB FRACTIONATION CASCADE
Hgb A2: 2.3 % (ref 1.8–3.2)
Hgb A: 97.7 % (ref 96.4–98.8)
Hgb F: 0 % (ref 0.0–2.0)
Hgb S: 0 %

## 2023-01-15 LAB — HAPTOGLOBIN: Haptoglobin: 112 mg/dL (ref 42–346)

## 2023-01-16 LAB — PNH PROFILE (-HIGH SENSITIVITY)

## 2023-01-21 ENCOUNTER — Encounter (HOSPITAL_COMMUNITY): Payer: Self-pay | Admitting: Hematology

## 2023-01-26 ENCOUNTER — Other Ambulatory Visit: Payer: Self-pay | Admitting: Adult Health

## 2023-01-26 DIAGNOSIS — N950 Postmenopausal bleeding: Secondary | ICD-10-CM

## 2023-01-29 ENCOUNTER — Other Ambulatory Visit: Payer: Medicare HMO

## 2023-02-02 ENCOUNTER — Ambulatory Visit
Admission: RE | Admit: 2023-02-02 | Discharge: 2023-02-02 | Disposition: A | Discharge status: Home / Self Care | Payer: Medicare HMO | Source: Ambulatory Visit | Attending: Adult Health | Admitting: Adult Health

## 2023-02-02 DIAGNOSIS — N950 Postmenopausal bleeding: Secondary | ICD-10-CM

## 2023-02-13 ENCOUNTER — Other Ambulatory Visit (HOSPITAL_COMMUNITY): Payer: Self-pay | Admitting: Cardiology

## 2023-02-13 DIAGNOSIS — R0602 Shortness of breath: Secondary | ICD-10-CM

## 2023-02-15 ENCOUNTER — Encounter (HOSPITAL_BASED_OUTPATIENT_CLINIC_OR_DEPARTMENT_OTHER): Payer: Medicare HMO | Admitting: Internal Medicine

## 2023-02-17 ENCOUNTER — Ambulatory Visit (HOSPITAL_COMMUNITY)
Admission: RE | Admit: 2023-02-17 | Discharge: 2023-02-17 | Disposition: A | Discharge status: Home / Self Care | Payer: Medicare HMO | Source: Ambulatory Visit | Attending: Cardiology | Admitting: Cardiology

## 2023-02-17 ENCOUNTER — Encounter (HOSPITAL_COMMUNITY): Payer: Self-pay | Admitting: Cardiology

## 2023-02-17 ENCOUNTER — Other Ambulatory Visit (HOSPITAL_COMMUNITY): Payer: Self-pay

## 2023-02-17 VITALS — BP 130/78 | HR 87 | Wt 149.2 lb

## 2023-02-17 DIAGNOSIS — Z955 Presence of coronary angioplasty implant and graft: Secondary | ICD-10-CM | POA: Diagnosis not present

## 2023-02-17 DIAGNOSIS — Z7982 Long term (current) use of aspirin: Secondary | ICD-10-CM | POA: Insufficient documentation

## 2023-02-17 DIAGNOSIS — R5383 Other fatigue: Secondary | ICD-10-CM | POA: Diagnosis not present

## 2023-02-17 DIAGNOSIS — I2510 Atherosclerotic heart disease of native coronary artery without angina pectoris: Secondary | ICD-10-CM | POA: Insufficient documentation

## 2023-02-17 DIAGNOSIS — I5032 Chronic diastolic (congestive) heart failure: Secondary | ICD-10-CM | POA: Diagnosis present

## 2023-02-17 DIAGNOSIS — N182 Chronic kidney disease, stage 2 (mild): Secondary | ICD-10-CM | POA: Insufficient documentation

## 2023-02-17 DIAGNOSIS — Z79899 Other long term (current) drug therapy: Secondary | ICD-10-CM | POA: Insufficient documentation

## 2023-02-17 DIAGNOSIS — I429 Cardiomyopathy, unspecified: Secondary | ICD-10-CM | POA: Diagnosis not present

## 2023-02-17 DIAGNOSIS — R0602 Shortness of breath: Secondary | ICD-10-CM | POA: Insufficient documentation

## 2023-02-17 MED ORDER — FUROSEMIDE 20 MG PO TABS: 20.0000 mg | ORAL_TABLET | Freq: Every day | ORAL | 3 refills | Status: DC

## 2023-02-17 MED ORDER — FARXIGA 10 MG PO TABS: 10.0000 mg | ORAL_TABLET | Freq: Every day | ORAL | 3 refills | Status: DC

## 2023-02-17 NOTE — Progress Notes (Signed)
   ADVANCED HEART FAILURE NEW PATIENT CLINIC NOTE  Referring Physician: Jeanine Knee, NP  Primary Care: Jeanine Knee, NP Primary Cardiologist:  HPI: Dana Beltran is a 78 y.o. female with a PMH of LVH, HFpEF, CKD, likely MGUS, hypertension, CAD s/p RCA and LAD PCI who presents for initial visit for further evaluation and treatment of heart failure/cardiomyopathy.      Patient has a known history of coronary artery disease with LAD and RCA PCI in 2016.  She was very active through her church, worked as a runner, broadcasting/film/video, and was generally feeling well until around September of this year.  At that time she began to have progressive shortness of breath and dyspnea on exertion as well as limiting fatigue.  She has undergone significant workup including bone marrow biopsy, nuclear stress test, and echocardiogram.  She was referred to heart failure clinic given concern for cardiac amyloid.     SUBJECTIVE: Patient reports ongoing symptoms of fatigue and shortness of breath. She even has difficulties talking for a long period of time and had to take breaks during the exam. She has a previous history of syncope, though no organic cause was found. Denies any active chest pain.  PMH, current medications, allergies, social history, and family history reviewed in epic.  PHYSICAL EXAM: Vitals:   02/17/23 0910  BP: 130/78  Pulse: 87  SpO2: 100%   GENERAL: Well nourished and in no apparent distress at rest.  HEENT: The mucous membranes are pink and moist.   PULM:  Normal work of breathing, clear to auscultation bilaterally. Respirations are unlabored.  CARDIAC:  JVP: Mildly elevated         Normal rate with regular rhythm. No murmurs, rubs or gallops.  1+ LE edema.  ABDOMEN: Soft, non-tender, non-distended. NEUROLOGIC: Patient is oriented x3 with no focal or lateralizing neurologic deficits.  PSYCH: Patients affect is appropriate, there is no evidence of anxiety or depression.  SKIN: Warm and dry; no  lesions or wounds. Warm and well perfused extremities.  DATA REVIEW  ECG: 02/2023: NSR with LAE, LVH    ECHO: 10/30/22: Severe LVH, reported normal diastology, AV calcifications  CATH: Last in 2016, RCA and LAD PCI Normal recent Lexiscan     Heart failure review: - Classification: Heart failure with preserved EF - Etiology: Work up ongoing - NYHA Class: III - Volume status: Hypervolemic   ASSESSMENT & PLAN:  Chronic HFpEF: Patient volume overloaded in clinic today, not currently on diuretic. 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 She will have her PYP scan tomorrow, if positive will start tafamadis and continue diuresis. - Start oral lasix  20mg  daily - Start farxiga  10mg  daily, no history of UTI - Stop amlodipine  - Plan to transition valsartan-hydrochlorothiazide to entresto pending workup - Reduce BB at next visit - PYP  CAD: No active chest pain or pressure, though does have limiting shortness of breath. Recent Lexiscan  was normal, will plan on completely TTR amyloid workup. If negative, may benefit from repeat catheterization. 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 - SGLT-2 as above    [name], MD Advanced Heart Failure Mechanical Circulatory Support 02/17/23

## 2023-02-17 NOTE — Patient Instructions (Signed)
 START Lasix  20 mg daily.  START Farxiga  10 mg daily.  Your physician recommends that you schedule a follow-up appointment in: 1 month  If you have any questions or concerns before your next appointment please send us  a message through [place] or call our office at [phone].    TO LEAVE A MESSAGE FOR THE NURSE SELECT OPTION 2, PLEASE LEAVE A MESSAGE INCLUDING: YOUR NAME DATE OF BIRTH CALL BACK NUMBER REASON FOR CALL**this is important as we prioritize the call backs  YOU WILL RECEIVE A CALL BACK THE SAME DAY AS LONG AS YOU CALL BEFORE 4:00 PM  At the Advanced [facility] Failure [facility], you and your health needs are our priority. As part of our continuing mission to provide you with exceptional heart care, we have created designated Provider Care Teams. These Care Teams include your primary Cardiologist (physician) and Advanced Practice Providers (APPs- Physician Assistants and Nurse Practitioners) who all work together to provide you with the care you need, when you need it.   You may see any of the following providers on your designated Care Team at your next follow up: Dr Toribio Fuel Dr Ezra Shuck Dr. Ria Commander Dr. [name] Amy Lenetta, NP Caffie Shed, [place] [facility] [place], [place] Beckey Coe, NP Jordan Lee, NP Tinnie Redman, PharmD   Please be sure to bring in all your medications bottles to every appointment.    Thank you for choosing [facility]

## 2023-02-18 ENCOUNTER — Ambulatory Visit (HOSPITAL_BASED_OUTPATIENT_CLINIC_OR_DEPARTMENT_OTHER): Payer: Medicare HMO

## 2023-02-18 DIAGNOSIS — R0602 Shortness of breath: Secondary | ICD-10-CM

## 2023-02-18 DIAGNOSIS — I5032 Chronic diastolic (congestive) heart failure: Secondary | ICD-10-CM | POA: Diagnosis not present

## 2023-02-18 LAB — MYOCARDIAL AMYLOID PLANAR & SPECT: H/CL Ratio: 1.03

## 2023-02-18 MED ORDER — TECHNETIUM TC 99M PYROPHOSPHATE
21.9000 | Freq: Once | INTRAVENOUS | Status: AC
  Administered 2023-02-18: 21.9 via INTRAVENOUS

## 2023-02-24 ENCOUNTER — Ambulatory Visit (HOSPITAL_BASED_OUTPATIENT_CLINIC_OR_DEPARTMENT_OTHER): Payer: Medicare HMO | Admitting: Internal Medicine

## 2023-02-24 ENCOUNTER — Other Ambulatory Visit: Payer: Self-pay

## 2023-02-24 DIAGNOSIS — D472 Monoclonal gammopathy: Secondary | ICD-10-CM

## 2023-03-03 ENCOUNTER — Encounter (HOSPITAL_COMMUNITY): Payer: Self-pay

## 2023-03-04 ENCOUNTER — Ambulatory Visit (HOSPITAL_BASED_OUTPATIENT_CLINIC_OR_DEPARTMENT_OTHER): Payer: Medicare HMO | Attending: Adult Health | Admitting: Internal Medicine

## 2023-03-04 ENCOUNTER — Telehealth (HOSPITAL_COMMUNITY): Payer: Self-pay | Admitting: *Deleted

## 2023-03-04 VITALS — Ht 65.0 in | Wt 140.0 lb

## 2023-03-04 DIAGNOSIS — R0683 Snoring: Secondary | ICD-10-CM | POA: Diagnosis present

## 2023-03-04 DIAGNOSIS — R0681 Apnea, not elsewhere classified: Secondary | ICD-10-CM | POA: Diagnosis present

## 2023-03-04 DIAGNOSIS — R5383 Other fatigue: Secondary | ICD-10-CM | POA: Diagnosis present

## 2023-03-04 DIAGNOSIS — I272 Pulmonary hypertension, unspecified: Secondary | ICD-10-CM | POA: Diagnosis not present

## 2023-03-04 NOTE — Telephone Encounter (Signed)
Reaching out to patient to offer assistance regarding upcoming cardiac imaging study; pt verbalizes understanding of appt date/time, parking situation and where to check in, pre-test NPO status and medications ordered, and verified current allergies; name and call back number provided for further questions should they arise Dana Frame RN Navigator Cardiac Imaging [facility] [phone] office [phone] cell  Patient denies metal, reports mild claustrophobia.

## 2023-03-05 ENCOUNTER — Ambulatory Visit (HOSPITAL_COMMUNITY)
Admission: RE | Admit: 2023-03-05 | Discharge: 2023-03-05 | Disposition: A | Discharge status: Home / Self Care | Payer: Medicare HMO | Source: Ambulatory Visit | Attending: Cardiology | Admitting: Cardiology

## 2023-03-05 ENCOUNTER — Other Ambulatory Visit: Payer: Self-pay | Admitting: Cardiology

## 2023-03-05 DIAGNOSIS — I517 Cardiomegaly: Secondary | ICD-10-CM | POA: Insufficient documentation

## 2023-03-05 MED ORDER — GADOBUTROL 1 MMOL/ML IV SOLN
10.0000 mL | Freq: Once | INTRAVENOUS | Status: AC | PRN
  Administered 2023-03-05: 10 mL via INTRAVENOUS

## 2023-03-08 DIAGNOSIS — R0683 Snoring: Secondary | ICD-10-CM | POA: Diagnosis not present

## 2023-03-08 NOTE — Procedures (Signed)
    Patient Name: Dana Beltran, Dana Beltran Date: 03/04/2023 Gender: Female D.O.B: 1945/05/24 Age (years): 6 Referring Provider: Judd Gaudier NP Height (inches): 65 Interpreting Physician: Jetty Duhamel MD, ABSM Weight (lbs): 140 RPSGT: Lowry Ram BMI: 23 MRN: [id number] Neck Size: 12.50  CLINICAL INFORMATION Sleep Study Type: NPSG Indication for sleep study: Diabetes, Fatigue, Hypertension, Snoring Epworth Sleepiness Score: 13  SLEEP STUDY TECHNIQUE As per the AASM Manual for the Scoring of Sleep and Associated Events v2.3 (April 2016) with a hypopnea requiring 4% desaturations.  The channels recorded and monitored were frontal, central and occipital EEG, electrooculogram (EOG), submentalis EMG (chin), nasal and oral airflow, thoracic and abdominal wall motion, anterior tibialis EMG, snore microphone, electrocardiogram, and pulse oximetry.  MEDICATIONS Medications self-administered by patient taken the night of the study : none reported  SLEEP ARCHITECTURE The study was initiated at 10:32:45 PM and ended at 4:36:10 AM.  Sleep onset time was 42.9 minutes and the sleep efficiency was 68.8%. The total sleep time was 250 minutes.  Stage REM latency was 51.5 minutes.  The patient spent 4.2% of the night in stage N1 sleep, 77.6% in stage N2 sleep, 0.0% in stage N3 and 18.2% in REM.  Alpha intrusion was absent.  Supine sleep was 29.40%.  RESPIRATORY PARAMETERS The overall apnea/hypopnea index (AHI) was 0.2 per hour. There were 0 total apneas, including 0 obstructive, 0 central and 0 mixed apneas. There were 1 hypopneas and 0 RERAs.  The AHI during Stage REM sleep was 1.3 per hour.  AHI while supine was 0.8 per hour.  The mean oxygen saturation was 95.9%. The minimum SpO2 during sleep was 90.0%.  soft snoring was noted during this study.  CARDIAC DATA The 2 lead EKG demonstrated sinus rhythm. The mean heart rate was 59.6 beats per minute. Other EKG findings include:  PVCs.  LEG MOVEMENT DATA The total PLMS were 0 with a resulting PLMS index of 0.0. Associated arousal with leg movement index was 0.0 .  IMPRESSIONS - No significant obstructive sleep apnea occurred during this study (AHI = 0.2/h). - The patient had minimal or no oxygen desaturation during the study (Min O2 = 90.0%) - The patient snored with soft snoring volume. - EKG findings include PVCs. - Clinically significant periodic limb movements did not occur during sleep. No significant associated arousals. - Unremarkable sleep architecture.  DIAGNOSIS - Normal study  RECOMMENDATIONS - Manage for symptoms based on clinical judgment. - Sleep hygiene should be reviewed to assess factors that may improve sleep quality. - Weight management and regular exercise should be initiated or continued if appropriate.  [Electronically signed] 03/08/2023 01:02 PM  Jetty Duhamel MD, ABSM Diplomate, American Board of Sleep Medicine NPI: [id number]                         Jetty Duhamel Diplomate, American Board of Sleep Medicine  ELECTRONICALLY SIGNED ON:  03/08/2023, 12:59 PM [facility] PH: [phone]   FX: [phone] ACCREDITED BY THE AMERICAN ACADEMY OF SLEEP MEDICINE

## 2023-03-12 ENCOUNTER — Telehealth (HOSPITAL_COMMUNITY): Payer: Self-pay | Admitting: Cardiology

## 2023-03-12 NOTE — Telephone Encounter (Signed)
 Pt would like to know if she was supposed to stop norvasc . Reports she saw PCP and together could not get clear understanding on if she should stop, PCP concerned if another medication would be prescribed in its place.  Per Dr Zenaida 02/17/23  Chronic HFpEF: Patient volume overloaded in clinic today, not currently on diuretic. 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 She will have her PYP scan tomorrow, if positive will start tafamadis and continue diuresis. - Start oral lasix  20mg  daily - Start farxiga  10mg  daily, no history of UTI - Stop amlodipine  - Plan to transition valsartan-hydrochlorothiazide to entresto pending workup - Reduce BB at next visit - PYP     Will new medication be introduced at follow up?

## 2023-03-12 NOTE — Telephone Encounter (Signed)
 Stop amlodipine  as suggested at office visit. She was started on lasix  at last visit which does lower BP to a degree. Dr. Alease Amend was planning to decide on further medication adjustments at follow-up next week. Keep log of BP at home in meantime.

## 2023-03-13 NOTE — Telephone Encounter (Signed)
 Pt aware and voiced    Your physician has requested that you regularly monitor and record your blood pressure readings at home. Please use the same machine at the same time of day to check your readings and record them to bring to your follow-up visit.

## 2023-03-17 ENCOUNTER — Telehealth (HOSPITAL_COMMUNITY): Payer: Self-pay | Admitting: Cardiology

## 2023-03-17 NOTE — Telephone Encounter (Signed)
Called patient at [phone] and spoke to patient in regards to her appointment with Dr. Elwyn Lade on Wednesday, March 18, 2023 at 9:20 AM.   Patient wrote down address, name of doctor and time of appointment - - all information provided by front office staff.   Patient was concerned that the weather may be bad and was glad that front office gave her a reminder call for her appointment.   Patient confirmed with front office that she will be here for her appointment on 03/18/23 at 9:00 AM.

## 2023-03-18 ENCOUNTER — Encounter (HOSPITAL_COMMUNITY): Payer: Self-pay | Admitting: Cardiology

## 2023-03-18 ENCOUNTER — Ambulatory Visit (HOSPITAL_COMMUNITY)
Admission: RE | Admit: 2023-03-18 | Discharge: 2023-03-18 | Disposition: A | Discharge status: Home / Self Care | Payer: Medicare (Managed Care) | Source: Ambulatory Visit | Attending: Cardiology | Admitting: Cardiology

## 2023-03-18 VITALS — BP 112/64 | HR 57 | Wt 145.0 lb

## 2023-03-18 DIAGNOSIS — I2510 Atherosclerotic heart disease of native coronary artery without angina pectoris: Secondary | ICD-10-CM | POA: Diagnosis not present

## 2023-03-18 DIAGNOSIS — I10 Essential (primary) hypertension: Secondary | ICD-10-CM | POA: Diagnosis not present

## 2023-03-18 DIAGNOSIS — I5032 Chronic diastolic (congestive) heart failure: Secondary | ICD-10-CM | POA: Diagnosis not present

## 2023-03-18 DIAGNOSIS — R0602 Shortness of breath: Secondary | ICD-10-CM | POA: Insufficient documentation

## 2023-03-18 DIAGNOSIS — Z79899 Other long term (current) drug therapy: Secondary | ICD-10-CM | POA: Diagnosis not present

## 2023-03-18 DIAGNOSIS — I517 Cardiomegaly: Secondary | ICD-10-CM

## 2023-03-18 DIAGNOSIS — I5030 Unspecified diastolic (congestive) heart failure: Secondary | ICD-10-CM | POA: Diagnosis present

## 2023-03-18 DIAGNOSIS — Z7982 Long term (current) use of aspirin: Secondary | ICD-10-CM | POA: Diagnosis not present

## 2023-03-18 DIAGNOSIS — N182 Chronic kidney disease, stage 2 (mild): Secondary | ICD-10-CM | POA: Insufficient documentation

## 2023-03-18 DIAGNOSIS — Z955 Presence of coronary angioplasty implant and graft: Secondary | ICD-10-CM | POA: Insufficient documentation

## 2023-03-18 MED ORDER — METOPROLOL SUCCINATE ER 25 MG PO TB24: 25.0000 mg | ORAL_TABLET | Freq: Every day | ORAL | 3 refills | Status: DC

## 2023-03-18 MED ORDER — SPIRONOLACTONE 25 MG PO TABS: 25.0000 mg | ORAL_TABLET | Freq: Every day | ORAL | 3 refills | Status: DC

## 2023-03-18 NOTE — Progress Notes (Signed)
   ADVANCED HEART FAILURE FOLLOW UP CLINIC NOTE  Referring Physician: Judd Gaudier, NP  Primary Care: Judd Gaudier, NP Primary Cardiologist:  HPI: Dana Beltran is a 78 y.o. female with PMH of LVH, HFpEF, CKD, likely MGUS, hypertension, CAD s/p RCA and LAD PCI who presents for follow up of heart failure with preserved EF.      Patient has a known history of coronary artery disease with LAD and RCA PCI in 2016. She was very active through her church, worked as a Runner, broadcasting/film/video, and was generally feeling well until around September of this year. At that time she began to have progressive shortness of breath and dyspnea on exertion as well as limiting fatigue. She has undergone significant workup including bone marrow biopsy, nuclear stress test, and echocardiogram. She was referred to heart failure clinic given concern for cardiac amyloid.      SUBJECTIVE:  Patient overall reports that she is feeling better since her last visit.  Her swelling in her legs has improved, her exertional capacity has improved as well and she is able to sleep better at night.  She has not had significant weight loss, but her appetite is better since she has been eating more.  She has had no issues with low blood pressures for the medications.  PMH, current medications, allergies, social history, and family history reviewed in epic.  PHYSICAL EXAM: Vitals:   03/18/23 0900  BP: 112/64  Pulse: (!) 57  SpO2: 99%   GENERAL: Well nourished and in no apparent distress at rest.  PULM:  Normal work of breathing, clear to auscultation bilaterally. Respirations are unlabored.  CARDIAC:  JVP: Mildly elevated Bradycardic and regular rhythm. No murmurs, rubs or gallops.  1+ LE edema. Warm and well perfused extremities. ABDOMEN: Soft, non-tender, non-distended. NEUROLOGIC: Patient is oriented x3 with no focal or lateralizing neurologic deficits.    DATA REVIEW  ECG: 02/2023: NSR with LAE, LVH     ECHO: 10/30/22: Severe  LVH, reported normal diastology, AV calcifications   CATH: Last in 2016, RCA and LAD PCI Normal recent Lexiscan   CMR: Normal LV, RVEF, no evidence of infiltrative disease, HTN disease  PYP: Negative  Heart failure review: - Classification: Heart failure with preserved EF - Etiology: Suspected HTN related - NYHA Class: III - Volume status: Hypervolemic  ASSESSMENT & PLAN:  Heart failure with preserved EF: TTR workup negative, feeling improved on current therapy. Will continue to augment diuresis and transition BP meds.  - Continue lasix 20mg  daily - Continue farxiga 10mg  daliy - Start spironolactone 25mg  daily - Reduce metoprolol to 25mg  daily - Plan to transition valsartan-hydrochlorothiazide to entresto at next visit  CAD: No active chest pain or pressure, though does have limiting shortness of breath. Recent Lexiscan was normal and improving symptomatically. 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  - Follows with hematology   CKD: Stage II, recent improvement in creatinine. - SGLT-2 as above - Repeat labs ordered   Follow up in 3 months  Clearnce Hasten, MD Advanced Heart Failure Mechanical Circulatory Support 03/18/23

## 2023-03-18 NOTE — Patient Instructions (Signed)
DECREASE Metoprol to 25 mg daily.  START Spironolactone 25 mg daily.  Your physician recommends that you schedule a follow-up appointment in: 3 months (May) ** PLEASE CALL THE OFFICE IN MARCH TO ARRANGE YOUR FOLLOW UP APPOINTMENT.**  If you have any questions or concerns before your next appointment please send [place] a message through [place] or call our office at [phone].    TO LEAVE A MESSAGE FOR THE NURSE SELECT OPTION 2, PLEASE LEAVE A MESSAGE INCLUDING: YOUR NAME DATE OF BIRTH CALL BACK NUMBER REASON FOR CALL**this is important as we prioritize the call backs  YOU WILL RECEIVE A CALL BACK THE SAME DAY AS LONG AS YOU CALL BEFORE 4:00 PM  At the [facility], you and your health needs are our priority. As part of our continuing mission to provide you with exceptional heart care, we have created designated Provider Care Teams. These Care Teams include your primary Cardiologist (physician) and Advanced Practice Providers (APPs- Physician Assistants and Nurse Practitioners) who all work together to provide you with the care you need, when you need it.   You may see any of the following providers on your designated Care Team at your next follow up: Dr Arvilla Meres Dr Marca Ancona Dr. Dorthula Nettles Dr. Clearnce Hasten Amy Filbert Schilder, NP Robbie Lis, [place] [facility] [place], [place] Brynda Peon, NP [place] Lee, NP Karle Plumber, PharmD   Please be sure to bring in all your medications bottles to every appointment.    Thank you for choosing [facility]

## 2023-03-25 ENCOUNTER — Other Ambulatory Visit: Payer: Medicare HMO

## 2023-04-01 ENCOUNTER — Inpatient Hospital Stay: Payer: Medicare (Managed Care) | Attending: Hematology

## 2023-04-01 ENCOUNTER — Ambulatory Visit: Payer: Medicare HMO | Admitting: Hematology

## 2023-04-01 DIAGNOSIS — Z860100 Personal history of colon polyps, unspecified: Secondary | ICD-10-CM | POA: Diagnosis not present

## 2023-04-01 DIAGNOSIS — R779 Abnormality of plasma protein, unspecified: Secondary | ICD-10-CM | POA: Insufficient documentation

## 2023-04-01 DIAGNOSIS — R0602 Shortness of breath: Secondary | ICD-10-CM | POA: Insufficient documentation

## 2023-04-01 DIAGNOSIS — G479 Sleep disorder, unspecified: Secondary | ICD-10-CM | POA: Insufficient documentation

## 2023-04-01 DIAGNOSIS — D472 Monoclonal gammopathy: Secondary | ICD-10-CM

## 2023-04-01 DIAGNOSIS — N289 Disorder of kidney and ureter, unspecified: Secondary | ICD-10-CM | POA: Diagnosis not present

## 2023-04-01 DIAGNOSIS — R002 Palpitations: Secondary | ICD-10-CM | POA: Insufficient documentation

## 2023-04-01 DIAGNOSIS — E041 Nontoxic single thyroid nodule: Secondary | ICD-10-CM | POA: Diagnosis not present

## 2023-04-01 DIAGNOSIS — D649 Anemia, unspecified: Secondary | ICD-10-CM | POA: Diagnosis present

## 2023-04-01 LAB — CBC WITH DIFFERENTIAL (CANCER CENTER ONLY)
Abs Immature Granulocytes: 0.01 10*3/uL (ref 0.00–0.07)
Basophils Absolute: 0 10*3/uL (ref 0.0–0.1)
Basophils Relative: 1 %
Eosinophils Absolute: 0.2 10*3/uL (ref 0.0–0.5)
Eosinophils Relative: 4 %
HCT: 26.7 % — ABNORMAL LOW (ref 36.0–46.0)
Hemoglobin: 9.1 g/dL — ABNORMAL LOW (ref 12.0–15.0)
Immature Granulocytes: 0 %
Lymphocytes Relative: 31 %
Lymphs Abs: 1.3 10*3/uL (ref 0.7–4.0)
MCH: 28.4 pg (ref 26.0–34.0)
MCHC: 34.1 g/dL (ref 30.0–36.0)
MCV: 83.4 fL (ref 80.0–100.0)
Monocytes Absolute: 0.4 10*3/uL (ref 0.1–1.0)
Monocytes Relative: 9 %
Neutro Abs: 2.2 10*3/uL (ref 1.7–7.7)
Neutrophils Relative %: 55 %
Platelet Count: 208 10*3/uL (ref 150–400)
RBC: 3.2 MIL/uL — ABNORMAL LOW (ref 3.87–5.11)
RDW: 16.4 % — ABNORMAL HIGH (ref 11.5–15.5)
WBC Count: 4.1 10*3/uL (ref 4.0–10.5)
nRBC: 0 % (ref 0.0–0.2)

## 2023-04-01 LAB — CMP (CANCER CENTER ONLY)
ALT: 14 U/L (ref 0–44)
AST: 16 U/L (ref 15–41)
Albumin: 3.6 g/dL (ref 3.5–5.0)
Alkaline Phosphatase: 69 U/L (ref 38–126)
Anion gap: 6 (ref 5–15)
BUN: 34 mg/dL — ABNORMAL HIGH (ref 8–23)
CO2: 29 mmol/L (ref 22–32)
Calcium: 9 mg/dL (ref 8.9–10.3)
Chloride: 101 mmol/L (ref 98–111)
Creatinine: 1.53 mg/dL — ABNORMAL HIGH (ref 0.44–1.00)
GFR, Estimated: 35 mL/min — ABNORMAL LOW (ref >60)
Glucose, Bld: 90 mg/dL (ref 70–99)
Potassium: 4 mmol/L (ref 3.5–5.1)
Sodium: 136 mmol/L (ref 135–145)
Total Bilirubin: 0.4 mg/dL (ref 0.0–1.2)
Total Protein: 6.8 g/dL (ref 6.5–8.1)

## 2023-04-01 LAB — IRON AND IRON BINDING CAPACITY (CC-WL,HP ONLY)
Iron: 50 ug/dL (ref 28–170)
Saturation Ratios: 18 % (ref 10.4–31.8)
TIBC: 276 ug/dL (ref 250–450)
UIBC: 226 ug/dL (ref 148–442)

## 2023-04-01 LAB — FERRITIN: Ferritin: 173 ng/mL (ref 11–307)

## 2023-04-03 LAB — ERYTHROPOIETIN: Erythropoietin: 29 m[IU]/mL — ABNORMAL HIGH (ref 2.6–18.5)

## 2023-04-07 ENCOUNTER — Telehealth: Payer: Self-pay | Admitting: Cardiology

## 2023-04-07 ENCOUNTER — Encounter: Payer: Self-pay | Admitting: Cardiology

## 2023-04-07 ENCOUNTER — Ambulatory Visit: Payer: Medicare (Managed Care) | Attending: Cardiology | Admitting: Cardiology

## 2023-04-07 ENCOUNTER — Ambulatory Visit (INDEPENDENT_AMBULATORY_CARE_PROVIDER_SITE_OTHER): Payer: Medicare (Managed Care)

## 2023-04-07 VITALS — BP 138/84 | HR 70 | Resp 16 | Ht 65.0 in | Wt 152.6 lb

## 2023-04-07 DIAGNOSIS — I5032 Chronic diastolic (congestive) heart failure: Secondary | ICD-10-CM | POA: Insufficient documentation

## 2023-04-07 DIAGNOSIS — I25118 Atherosclerotic heart disease of native coronary artery with other forms of angina pectoris: Secondary | ICD-10-CM

## 2023-04-07 DIAGNOSIS — I3139 Other pericardial effusion (noninflammatory): Secondary | ICD-10-CM | POA: Diagnosis not present

## 2023-04-07 DIAGNOSIS — R002 Palpitations: Secondary | ICD-10-CM | POA: Insufficient documentation

## 2023-04-07 DIAGNOSIS — E782 Mixed hyperlipidemia: Secondary | ICD-10-CM | POA: Diagnosis not present

## 2023-04-07 NOTE — Telephone Encounter (Signed)
 Pt stated she has monitor put on today while at her office visit but once she arrived at home it started flashing orange. She then contacted the company and they'll be mailing her a new one out and she's going to ship the one she has on back. She stated she'll be going to see her pcp to have it removed and the new one put on to make sure it's done correctly and she just wanted to make the office aware. Please advise

## 2023-04-07 NOTE — Telephone Encounter (Signed)
 Ok to use new monitor.  Thanks MJP

## 2023-04-07 NOTE — Progress Notes (Unsigned)
 ZIO serial # [id number] from office inventory applied to patient.

## 2023-04-07 NOTE — Progress Notes (Signed)
  Cardiology Office Note:  .   Date:  04/07/2023  ID:  Dana Beltran, DOB 17-Oct-1945, MRN [id number] PCP: Judd Gaudier, NP  [facility] Providers Cardiologist:  Truett Mainland, MD PCP: Judd Gaudier, NP  Chief Complaint  Patient presents with   Coronary artery disease involving native coronary artery of   Follow-up      History of Present Illness: .    Dana Beltran is a 78 y.o. female with hypertension, hyperlipidemia, CAD, prior PCI's, HFpEF, CKD stage 3b  Detailed workup was negative.  MGUS noted on amyloid workup, not concerning for any disease.  Patient has follow-up with hematology.  She denies any chest pain, but reports rapid heartbeat with any amount of walking.  Leg edema persists, but has improved.  The medical therapy.  Blood pressure elevated on first check, improved on second check.  Vitals:   04/07/23 0839 04/07/23 0920  BP: (!) 158/82 138/84  Pulse: 70   Resp: 16   SpO2: 99%      ROS:  Review of Systems  Cardiovascular:  Positive for palpitations. Negative for chest pain, dyspnea on exertion, leg swelling and syncope.     Studies Reviewed: Marland Kitchen         Independently interpreted 03/2023: Cr 1.53, eGFR 35 Hb 9.1  12/2022: Cr 0.95  10/2022: Chol 170, TG 88, HDL 38, LDL 114 HbA1C 5.5% Hb 9.1 Cr 1.53  Independently interpreted Echocardiogram 10/2022: Severe LVH.  EF 65 to 70%.  Indeterminate diastolic function. Normal RV systolic function. Mild biatrial dilatation. Moderate pericardial effusion, no tamponade. Moderate TR, mildly elevated RVSP.    Physical Exam:   Physical Exam Vitals and nursing note reviewed.  Constitutional:      General: She is not in acute distress. Neck:     Vascular: No JVD.  Cardiovascular:     Rate and Rhythm: Normal rate and regular rhythm.     Heart sounds: Normal heart sounds. No murmur heard. Pulmonary:     Effort: Pulmonary effort is normal.     Breath sounds: Normal breath sounds. No wheezing or  rales.  Musculoskeletal:     Right lower leg: Edema (Trace) present.     Left lower leg: Edema (Trace) present.      VISIT DIAGNOSES:   ICD-10-CM   1. Coronary artery disease of native artery of native heart with stable angina pectoris (HCC)  I25.118 Lipid panel    2. Chronic heart failure with preserved ejection fraction (HCC)  I50.32     3. Pericardial effusion  I31.39 ECHOCARDIOGRAM COMPLETE    4. Mixed hyperlipidemia  E78.2     5. Palpitations  R00.2 LONG TERM MONITOR (3-14 DAYS)       ASSESSMENT AND PLAN: .    [name] is a 78 y.o. female with hypertension, hyperlipidemia, CAD, prior PCI's, HFpEF, CKD stage 3b  CAD: No anginal symptoms. Continue aspirin, Lipitor 80 mg daily. Check lipid panel today. If LDL remains >70, will add Zetia 10 mg daily.  Palpitations: I suspect this is largely due to her underlying anemia.  That said, we will obtain 1 week ZIO monitor to rule out any other tachyarrhythmia.  HFpEF: ATTR workup negative. Suspect HFpEF related to hypertension and CAD. Mild leg edema dyspnea symptoms. I have not made changes to her regimen.  Her GFR of 35 and ongoing use of valsartan-hydrochlorothiazide, and spironolactone.      F/u in 6 months  Signed, Elder Negus, MD

## 2023-04-07 NOTE — Patient Instructions (Addendum)
 Lab Work: LIPID PANEL  If you have labs (blood work) drawn today and your tests are completely normal, you will receive your results only by: MyChart Message (if you have MyChart) OR A paper copy in the mail If you have any lab test that is abnormal or we need to change your treatment, we will call you to review the results.   Testing/Procedures: Echo   Your physician has requested that you have an echocardiogram. Echocardiography is a painless test that uses sound waves to create images of your heart. It provides your doctor with information about the size and shape of your heart and how well your heart's chambers and valves are working. This procedure takes approximately one hour. There are no restrictions for this procedure. Please do NOT wear cologne, perfume, aftershave, or lotions (deodorant is allowed). Please arrive 15 minutes prior to your appointment time.  Please note: We ask at that you not bring children with you during ultrasound (echo/ vascular) testing. Due to room size and safety concerns, children are not allowed in the ultrasound rooms during exams. Our front office staff cannot provide observation of children in our lobby area while testing is being conducted. An adult accompanying a patient to their appointment will only be allowed in the ultrasound room at the discretion of the ultrasound technician under special circumstances. We apologize for any inconvenience.   Zio patch x1 week  Your physician has requested that you wear a Zio heart monitor for __7___ days. This will be mailed to your home with instructions on how to apply the monitor and how to return it when finished. Please allow 2 weeks after returning the heart monitor before our office calls you with the results.     Follow-Up: At [facility], you and your health needs are our priority.  As part of our continuing mission to provide you with exceptional heart care, we have created designated  Provider Care Teams.  These Care Teams include your primary Cardiologist (physician) and Advanced Practice Providers (APPs -  Physician Assistants and Nurse Practitioners) who all work together to provide you with the care you need, when you need it.   Your next appointment:   6 month(s)  Provider:   Elder Negus, MD     Other Instructions   1st Floor: - Lobby - Registration  - Pharmacy  - Lab - Cafe  2nd Floor: - PV Lab - Diagnostic Testing (echo, CT, nuclear med)  3rd Floor: - Vacant  4th Floor: - TCTS (cardiothoracic surgery) - AFib Clinic - Structural Heart Clinic - Vascular Surgery  - Vascular Ultrasound  5th Floor: - HeartCare Cardiology (general and EP) - Clinical Pharmacy for coumadin, hypertension, lipid, weight-loss medications, and med management appointments    Valet parking services will be available as well.

## 2023-04-08 ENCOUNTER — Telehealth: Payer: Self-pay | Admitting: *Deleted

## 2023-04-08 ENCOUNTER — Inpatient Hospital Stay: Payer: Medicare (Managed Care) | Attending: Hematology | Admitting: Hematology

## 2023-04-08 VITALS — BP 160/82 | HR 71 | Temp 97.7°F | Resp 18 | Ht 65.0 in | Wt 150.5 lb

## 2023-04-08 DIAGNOSIS — R779 Abnormality of plasma protein, unspecified: Secondary | ICD-10-CM | POA: Diagnosis not present

## 2023-04-08 DIAGNOSIS — E785 Hyperlipidemia, unspecified: Secondary | ICD-10-CM

## 2023-04-08 DIAGNOSIS — R002 Palpitations: Secondary | ICD-10-CM | POA: Diagnosis not present

## 2023-04-08 DIAGNOSIS — D509 Iron deficiency anemia, unspecified: Secondary | ICD-10-CM | POA: Insufficient documentation

## 2023-04-08 DIAGNOSIS — I2510 Atherosclerotic heart disease of native coronary artery without angina pectoris: Secondary | ICD-10-CM

## 2023-04-08 DIAGNOSIS — Z79899 Other long term (current) drug therapy: Secondary | ICD-10-CM | POA: Diagnosis not present

## 2023-04-08 DIAGNOSIS — D472 Monoclonal gammopathy: Secondary | ICD-10-CM

## 2023-04-08 DIAGNOSIS — E041 Nontoxic single thyroid nodule: Secondary | ICD-10-CM | POA: Diagnosis not present

## 2023-04-08 DIAGNOSIS — R0602 Shortness of breath: Secondary | ICD-10-CM | POA: Insufficient documentation

## 2023-04-08 DIAGNOSIS — Z860100 Personal history of colon polyps, unspecified: Secondary | ICD-10-CM | POA: Diagnosis not present

## 2023-04-08 DIAGNOSIS — E782 Mixed hyperlipidemia: Secondary | ICD-10-CM

## 2023-04-08 DIAGNOSIS — N189 Chronic kidney disease, unspecified: Secondary | ICD-10-CM | POA: Diagnosis not present

## 2023-04-08 DIAGNOSIS — R7989 Other specified abnormal findings of blood chemistry: Secondary | ICD-10-CM | POA: Diagnosis not present

## 2023-04-08 DIAGNOSIS — D649 Anemia, unspecified: Secondary | ICD-10-CM

## 2023-04-08 DIAGNOSIS — I25118 Atherosclerotic heart disease of native coronary artery with other forms of angina pectoris: Secondary | ICD-10-CM

## 2023-04-08 LAB — LIPID PANEL
Chol/HDL Ratio: 3 ratio (ref 0.0–4.4)
Cholesterol, Total: 235 mg/dL — ABNORMAL HIGH (ref 100–199)
HDL: 79 mg/dL (ref >39)
LDL Chol Calc (NIH): 142 mg/dL — ABNORMAL HIGH (ref 0–99)
Triglycerides: 83 mg/dL (ref 0–149)
VLDL Cholesterol Cal: 14 mg/dL (ref 5–40)

## 2023-04-08 MED ORDER — EZETIMIBE 10 MG PO TABS: 10.0000 mg | ORAL_TABLET | Freq: Every day | ORAL | 3 refills | Status: DC

## 2023-04-08 NOTE — Telephone Encounter (Signed)
 The patient has been notified of the result and verbalized understanding.  All questions (if any) were answered.  Pt agreed to start taking zetia 10 mg po daily and come in for repeat lipids in 3 months to reassess her numbers.   Confirmed the pharmacy of choice with the pt.   Pt marked down on her calendar when to report for 3 month lipids.  Lipids placed for then and released in the system.  Pt states she is also going to work hard on her diet as well.   Pt verbalized understanding and agrees with this plan.

## 2023-04-08 NOTE — Telephone Encounter (Signed)
-----   Message from [facility] sent at 04/08/2023 11:45 AM EST ----- Cholesterol remains high.   Patient reported that she does not want to try injections at this time. Add Zetia 10 mg daily. Repeat lipid panel in 3 months.  Thanks MJP

## 2023-04-08 NOTE — Progress Notes (Signed)
 Cholesterol remains high.   Patient reported that she does not want to try injections at this time. Add Zetia 10 mg daily. Repeat lipid panel in 3 months.  Thanks MJP

## 2023-04-08 NOTE — Progress Notes (Signed)
 HEMATOLOGY/ONCOLOGY CLINIC NOTE  Date of Service: 04/08/2023  Patient Care Team: Dana Gaudier, NP as PCP - General (Nurse Practitioner) Elder Negus, MD as PCP - Cardiology (Cardiology) Johney [place], MD as Consulting Physician (Hematology)  CHIEF COMPLAINTS/PURPOSE OF CONSULTATION:  progressive anemia, abnormal SPEP and normocytic anemia.  HISTORY OF PRESENTING ILLNESS:   Dana Beltran is a wonderful 78 y.o. female who has been referred to [place] by Dana Massed, DO for evaluation and management of progressive anemia, abnormal SPEP and normocytic anemia.  Patient was seen at [facility] by Dana Ser, NP on 09/15/2022.   Recent lab workup showed IgG of 1935 with M spike of 0.9, elevated kappa chains of 70.9 with normal lambda light chains 24.1. Ratio 2.94. hgb 8.9 on 11/07/2022.   She had a bone marrow biopsy yesterday and the results are pending at this time.   She reports that she has had anemia for a while, more so as she has gotten older. She has not required blood transfusions in the past.  Patient complains of feeling cold constantly. Patient complains of weakness for at least 2 months. She complains of brain fog.   She complains of palpitations with minimal activity and SOB. Patient reports that her heart thickening is managed by cardiology. She has follow up with her cardiologist on Tuesday of next week.   Patient reports that she was intermittently fasting for some time and did not eat after 8 pm. She consequently lost 60s pounds in less than 6 months. She has discontinued intermittent fasting at this time. Patient has been taking ensure. Patient complains of taste issues due to medication she takes. Patient reports that she has allergies to oranges, mango, and chocolate, among other foods. She notes that she did not drink water today.  She complains of needing to take Zofran daily prior to eating and drinking to prevent nausea. This is a new symptom.  She reports that she cannot eat full meals. She has no abdominal pain. She has not had a bowel movement for 2 weeks. She just started taking laxatives yesterday. Patient notes that generally, if she consumes something hot, it does cause a bowel movement. She has not tried this recently. She does have Colase at home.  She has had leg swelling for years. Patient reports that years ago, a door fell on her left leg, and has had leg swelling for years. She has bilateral leg swelling L>R. She has not been on any water pills recently but was previously years ago.   She reports that her maternal first cousin did have cancer. She is unaware of any other cancer in the family. Patient has a fhx of sickle cell disease and thalassemia, including in her cousin and aunt. Patient does not have a hx of sickle cell disease or thalassemia.  Patient lives by herself in an independent living facility. Her neighbors generally provide meals for her.   Patient reports that a few months ago a doctor found a thyroid nodule on her neck. She reports that she has been putting wrinkle cream on her neck regularly and never noticed it.   Her last mammogram was in September and showed normal findings. There were findings of polyps in her colon a few years ago. Patient had a colonoscopy last year which showed no concerns. She did previously have an endoscopy.  No new bone pain in spine, shoulders, or hips. No other new symptoms. She denies any abdominal pain.   INTERVAL HISTORY:  Dana Beltran is a wonderful 78 y.o. female who is here for continued evaluation and management of progressive anemia, abnormal SPEP and normocytic anemia.   Patient was last seen by me on 01/13/2023 and reported bilateral ankle swelling, light brown urine discoloration in the mornings, two small bumps on her hands, decreased appetite, sleep disturbances, mild insomnia, and slow HR when lying down.   Today, she is accompanied by an additional family  member/friend. Patient reports improved energy levels though she has not yet returned to her baseline.   Patient reports that her appetite has returned and she has been sleeping well. She admits to sub-optimal water intake.   She complains of persistent palpitations only after activity including with short-distance walking.   She reports that she does take vitamins though not on a regularly basis.   Patient denies any other infection issues in the last couple of months.   Patient denies any lightheadedness, dizziness, abdominal pain, black stools, blood in the stools, or other source of bleeding.   Patient reports improved leg swelling. Patient denies any skin breakdown in the legs. She is noted to be on three different diuretics, including Lasix, spironolactone, and hydrochlorothiazide.   Patient's free T4 level is slightly high at 1.50 ng/dL. TSH level is WNL. She reports that her PCP has been evaluating her thyroid levels and denies being seen by an endocrinologist.   She reports a fhx of sickle cell, which did not show on her hgb electrophoresis.   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 She reports that she does feel that she is physically able to return to her work, though she notes that she does need to stop to rest at times.   She reports that she will see her PCP next on 04/14/2023.   She reports that she is UTD with her age-appropriate vaccines including flu, RSV, and COVID-19.   Patient reports that her PCP and a few other doctors have told her that her immune system was weak.   MEDICAL HISTORY:  Past Medical History:  Diagnosis Date   Cataract disorder type 14    Coronary artery disease    Diabetes mellitus without complication (HCC)    Hypertension    Leg edema, left    after door fell on her at work    MI, old     SURGICAL HISTORY: Past Surgical History:  Procedure Laterality Date   CARDIAC SURGERY     Stent placement x2     SOCIAL  HISTORY: Social History   Socioeconomic History   Marital status: Divorced    Spouse name: Not on file   Number of children: 0   Years of education: Not on file   Highest education level: High school graduate  Occupational History   Not on file  Tobacco Use   Smoking status: Never   Smokeless tobacco: Never  Vaping Use   Vaping status: Never Used  Substance and Sexual Activity   Alcohol use: Never   Drug use: Never   Sexual activity: Not on file  Other Topics Concern   Not on file  Social History Narrative   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    Social Drivers of Health   Financial Resource Strain: Medium Risk (09/02/2022)   Received from [facility]   Overall Financial Resource Strain (CARDIA)    Difficulty of Paying Living Expenses: Somewhat hard  Food Insecurity: No Food Insecurity (10/29/2022)   Hunger Vital Sign    Worried About Running Out  of Food in the Last Year: Never true    Ran Out of Food in the Last Year: Never true  Transportation Needs: No Transportation Needs (10/29/2022)   PRAPARE - Administrator, Civil Service (Medical): No    Lack of Transportation (Non-Medical): No  Recent Concern: Transportation Needs - Unmet Transportation Needs (09/02/2022)   [facility] - Transportation    Lack of Transportation (Medical): Yes    Lack of Transportation (Non-Medical): Yes  Physical Activity: Not on file  Stress: Not on file  Social Connections: Unknown (08/25/2022)   [facility] from [facility]   Social Network    Social Network: Not on file  Intimate Partner Violence: Not At Risk (10/29/2022)   Humiliation, Afraid, Rape, and Kick questionnaire    Fear of Current or Ex-Partner: No    Emotionally Abused: No    Physically Abused: No    Sexually Abused: No    FAMILY HISTORY: Family History  Problem Relation Age of Onset   High blood pressure Father    Arthritis Father     ALLERGIES:  is allergic to caffeine, coconut  (cocos nucifera), orange oil, pineapple, prunus persica, strawberry extract, tomato, chocolate, citrus, dust mite extract, flavoring agent (non-screening), other, and peach flavoring agent (non-screening).   MEDICATIONS:  Current Outpatient Medications  Medication Sig Dispense Refill   ASPIRIN 81 PO Take 81 mg by mouth daily.     atorvastatin (LIPITOR) 80 MG tablet Take 1 tablet (80 mg total) by mouth daily. 90 tablet 3   FARXIGA 10 MG TABS tablet Take 1 tablet (10 mg total) by mouth daily before breakfast. 90 tablet 3   furosemide (LASIX) 20 MG tablet Take 1 tablet (20 mg total) by mouth daily. 90 tablet 3   metoprolol succinate (TOPROL-XL) 25 MG 24 hr tablet Take 1 tablet (25 mg total) by mouth daily. Take with or immediately following a meal. 90 tablet 3   spironolactone (ALDACTONE) 25 MG tablet Take 1 tablet (25 mg total) by mouth daily. 90 tablet 3   valsartan-hydrochlorothiazide (DIOVAN-HCT) 320-12.5 MG tablet TAKE 1 TABLET BY MOUTH  DAILY 90 tablet 3   No current facility-administered medications for this visit.    REVIEW OF SYSTEMS:    10 Point review of Systems was done is negative except as noted above.   PHYSICAL EXAMINATION: ECOG PERFORMANCE STATUS: 1 - Symptomatic but completely ambulatory  . Vitals:   04/08/23 1449  BP: (!) 160/82  Pulse: 71  Resp: 18  Temp: 97.7 F (36.5 C)  SpO2: 100%     Filed Weights   04/08/23 1449  Weight: 150 lb 8 oz (68.3 kg)     .Body mass index is 25.04 kg/m.   GENERAL:alert, in no acute distress and comfortable SKIN: no acute rashes, no significant lesions EYES: conjunctiva are pink and non-injected, sclera anicteric OROPHARYNX: MMM, no exudates, no oropharyngeal erythema or ulceration NECK: supple, no JVD LYMPH:  no palpable lymphadenopathy in the cervical, axillary or inguinal regions LUNGS: clear to auscultation b/l with normal respiratory effort HEART: regular rate & rhythm ABDOMEN:  normoactive bowel sounds , non  tender, not distended. Extremity: no pedal edema PSYCH: alert & oriented x 3 with fluent speech NEURO: no focal motor/sensory deficits   LABORATORY DATA:  I have reviewed the data as listed  .    Latest Ref Rng & Units 04/01/2023    8:57 AM 01/13/2023    3:21 PM 12/18/2022   11:02  AM  CBC  WBC 4.0 - 10.5 K/uL 4.1  4.6  2.9   Hemoglobin 12.0 - 15.0 g/dL 9.1  8.4  8.0   Hematocrit 36.0 - 46.0 % 26.7  24.4  23.0   Platelets 150 - 400 K/uL 208  308  293     .    Latest Ref Rng & Units 04/01/2023    8:57 AM 12/30/2022   10:54 AM 12/10/2022   10:25 AM  CMP  Glucose 70 - 99 mg/dL 90  96  161   BUN 8 - 23 mg/dL 34  15  31   Creatinine 0.44 - 1.00 mg/dL 0.96  0.45  4.09   Sodium 135 - 145 mmol/L 136  136  137   Potassium 3.5 - 5.1 mmol/L 4.0  4.2  3.4   Chloride 98 - 111 mmol/L 101  100  94   CO2 22 - 32 mmol/L 29  25  27    Calcium 8.9 - 10.3 mg/dL 9.0  9.5  9.6   Total Protein 6.5 - 8.1 g/dL 6.8     Total Bilirubin 0.0 - 1.2 mg/dL 0.4     Alkaline Phos 38 - 126 U/L 69     AST 15 - 41 U/L 16     ALT 0 - 44 U/L 14      Fish analysis MDS standard 01/21/2023:      12/17/2022 FISH Analysis Plasma cell Myeloma Prognostic Panel:       Lab work from 09/05/2022:         RADIOGRAPHIC STUDIES: I have personally reviewed the radiological images as listed and agreed with the findings in the report. Sleep Study Documents Result Date: 03/24/2023 Ordered by an unspecified provider.  Sleep Study Documents Result Date: 03/20/2023 Ordered by an unspecified provider.   ASSESSMENT & PLAN:   78 y.o. female with:  Progressive normocytic Anemia abnormal SPEP  3. Renal insufficiency  PLAN:  -Discussed lab results from 04/01/2023 in detail with patient. CBC showed WBC of 4.1K, hemoglobin of 9.1, hematocrit 26.7 and platelets of 208K. -patient is noted to have normocytic anemia. Her Hgb improved from 8.4 two months ago to 9.1 currently -WBCs and platelets  normalized -patient does have mild anemia -hgb imprved from 7s three months ago to 9.1 currently -iron saturation level is 18%  -ferritin level is 173 -discussed that given her hx of CKD, her ferritin goal would be at least 250 and iron saturation goal is 30% -patient has elevated creatinine level of 1.53 mg/dL related to CKD -genetic panel showed no obvious suggestion of MDS -hemoglobin electrophoreses is non-revealing -PNH testing showed No evidence of paroxysmal nocturnal hemoglobinuria  -No sign of active hemolysis -no abnormal antibodies -do not believe that patient primarily has MGUS -elevated free T4 of 1.50 ng/dL with 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  -her abnormal protein in the blood is stable and we shall continue to  track this -discussed that there is a need to optimize iron levels with either two infusions of IV iron, which would be recommended or oral iron which may cause constipation -discussed that IV iron would typically correct iron levels in 2 weeks while oral iron typically replaced iron levels in 6-12 months -discussed that her palpitations could be from being deconditioned. Recommend walking to recondition the muscles and heart. Her palpitations could also be related to the capacity of oxygen that her blood is carrying -educated patient that high thyroid levels can also cause  palpitations -her diuretics could be causing dehydration -advised patient to connect with her PCP to evaluate whether she needs to continue hydrochlorothiazide  -recommend drinking close to 64 ounces of water intake daily -recommend taking one capsule of vitamin B complex daily -recommend 2000-4000 units of vitamin D3 daily -will plan to repeat labs in 3 months after IV iron -answered all of patient's questions in detail  FOLLOW-UP: IV feraheme 510mg  weekly x 2 doses at [address]  RTC with Dr Candise Che with labs in 3 months  The total time spent in the appointment was 32 minutes* .  All of the patient's questions were answered with apparent satisfaction. The patient knows to call the clinic with any problems, questions or concerns.   Wyvonnia Lora MD MS AAHIVMS [facility] Hematology/Oncology Physician [facility] [place]  .*Total Encounter Time as defined by the Centers for Medicare and Medicaid Services includes, in addition to the face-to-face time of a patient visit (documented in the note above) non-face-to-face time: obtaining and reviewing outside history, ordering and reviewing medications, tests or procedures, care coordination (communications with other health care professionals or caregivers) and documentation in the medical record.    I,Mitra Faeizi,acting as a Neurosurgeon for Wyvonnia Lora, MD.,have documented all relevant documentation on the behalf of Wyvonnia Lora, MD,as directed by  Wyvonnia Lora, MD while in the presence of Wyvonnia Lora, MD.  .I have reviewed the above documentation for accuracy and completeness, and I agree with the above. Johney [place] MD

## 2023-04-09 ENCOUNTER — Telehealth: Payer: Self-pay | Admitting: Hematology

## 2023-04-09 NOTE — Telephone Encounter (Signed)
 Spoke with patient confirming upcoming appointment

## 2023-04-14 DIAGNOSIS — R002 Palpitations: Secondary | ICD-10-CM

## 2023-04-16 ENCOUNTER — Other Ambulatory Visit: Payer: Self-pay | Admitting: Adult Health

## 2023-04-16 DIAGNOSIS — E041 Nontoxic single thyroid nodule: Secondary | ICD-10-CM

## 2023-04-28 ENCOUNTER — Ambulatory Visit (HOSPITAL_COMMUNITY): Payer: Medicare (Managed Care) | Attending: Cardiology

## 2023-04-28 ENCOUNTER — Encounter: Payer: Self-pay | Admitting: Cardiology

## 2023-04-28 DIAGNOSIS — I3139 Other pericardial effusion (noninflammatory): Secondary | ICD-10-CM | POA: Insufficient documentation

## 2023-04-28 LAB — ECHOCARDIOGRAM COMPLETE
Area-P 1/2: 3.05 cm2
S' Lateral: 2.5 cm

## 2023-05-05 ENCOUNTER — Other Ambulatory Visit: Payer: Medicare (Managed Care)

## 2023-05-05 ENCOUNTER — Ambulatory Visit
Admission: RE | Admit: 2023-05-05 | Discharge: 2023-05-05 | Disposition: A | Discharge status: Home / Self Care | Payer: Medicare (Managed Care) | Source: Ambulatory Visit | Attending: Adult Health | Admitting: Adult Health

## 2023-05-05 DIAGNOSIS — E041 Nontoxic single thyroid nodule: Secondary | ICD-10-CM

## 2023-05-06 ENCOUNTER — Encounter: Payer: Self-pay | Admitting: Cardiology

## 2023-05-20 ENCOUNTER — Encounter: Payer: Self-pay | Admitting: Family

## 2023-05-20 ENCOUNTER — Ambulatory Visit (INDEPENDENT_AMBULATORY_CARE_PROVIDER_SITE_OTHER): Payer: Medicare (Managed Care) | Admitting: Family

## 2023-05-20 VITALS — BP 138/78 | HR 63 | Temp 97.6°F | Resp 20 | Ht 65.0 in | Wt 143.8 lb

## 2023-05-20 DIAGNOSIS — E782 Mixed hyperlipidemia: Secondary | ICD-10-CM | POA: Diagnosis not present

## 2023-05-20 DIAGNOSIS — I25118 Atherosclerotic heart disease of native coronary artery with other forms of angina pectoris: Secondary | ICD-10-CM | POA: Diagnosis not present

## 2023-05-20 DIAGNOSIS — I3139 Other pericardial effusion (noninflammatory): Secondary | ICD-10-CM

## 2023-05-20 DIAGNOSIS — I10 Essential (primary) hypertension: Secondary | ICD-10-CM

## 2023-05-20 DIAGNOSIS — D219 Benign neoplasm of connective and other soft tissue, unspecified: Secondary | ICD-10-CM

## 2023-05-20 DIAGNOSIS — I5032 Chronic diastolic (congestive) heart failure: Secondary | ICD-10-CM

## 2023-05-20 NOTE — Progress Notes (Signed)
 Provider: Richarda Blade FNP-C   Effie Janoski, Donalee Citrin, NP  Patient Care Team: Edker Punt, Donalee Citrin, NP as PCP - General (Family Medicine) Elder Negus, MD as PCP - Cardiology (Cardiology) Johney Maine, MD as Consulting Physician (Hematology)  Extended Emergency Contact Information Primary Emergency Contact: Mills,Diana Home Phone: [phone] Mobile Phone: [phone] Relation: Friend Interpreter needed? No Secondary Emergency Contact: [facility] Phone: [phone] Mobile Phone: [phone] Relation: Relative  Code Status:  Full Code  Goals of care: Advanced Directive information    05/20/2023    9:55 AM  Advanced Directives  Does Patient Have a Medical Advance Directive? No  Would patient like information on creating a medical advance directive? No - Patient declined     Chief Complaint  Patient presents with   Establish Care    New patient appointment.    Discussed the use of AI scribe software for clinical note transcription with the patient, who gave verbal consent to proceed.  History of Present Illness   Dana Beltran is a 78 year old female with chronic heart failure, Hypertension, Hyperlipidemia, Anemia, CKD stage 3 who presents to establish care after her previous provider's office closed.  Chronic heart failure is her major health concern, with left ventricular narrowing affecting blood flow and a small amount of fluid around the heart. 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 She experiences palpitations on exertion, causing anxiety about potential heart attack or stroke, especially since she lives alone. She is eager to return to normal activities, including teaching, attending clubs, and going to church.  She has a history of anemia and is under the care of a hematologist Dr.Kale. Although she suspects a  familial link to thalassemia or sickle cell anemia, these conditions have been ruled out. Her hemoglobin levels have improved from 8.0 to 9.1, but she remains anemic.  She has high cholesterol, managed with atorvastatin and Zetia. She acknowledges dietary indiscretions, such as consuming bacon and pork, which she believes contributed to her elevated cholesterol levels. Her total cholesterol is 235 mg/dL, and LDL is 284 mg/dL.  Hypertension is managed with valsartan, hydrochlorothiazide, metoprolol, spironolactone, and furosemide, which she takes for leg swelling. Her blood pressure at home is usually around 125/60 mmHg. No headaches, dizziness, chest pain, shortness of breath, or palpitations.  She reports a history of vaginal bleeding a few months ago, which resolved spontaneously. She has a history of fibroids, which she believes may contribute to her anemia.  She has experienced significant weight loss, dropping from 199 lbs to 130 lbs, and is currently at 143.8 lbs. She attributes this to her illness and lack of appetite. She also reports frequent urination, likely due to her diuretic use, and struggles with sleep, averaging about four hours per night. She lives alone and relies on neighbors and her former neighbor for assistance.    Past Medical History:  Diagnosis Date   Allergy 1959   Tested at 12 years   Anemia 2025   Tested positive   Arthritis 2024   Tested   Cataract disorder type 14    CHF (congestive heart failure) (HCC) 2016   Heart Attack   Coronary artery disease    Coronary artery disease    Diabetes mellitus without complication (HCC)    Hypertension    Hypovolemia    Leg edema, left    after door fell  on her at work    MI, old    Mixed hyperlipidemia    Snoring    Past Surgical History:  Procedure Laterality Date   CARDIAC SURGERY     Stent placement x4   EYE SURGERY  2023   [place]   pericardial effusion      Allergies  Allergen Reactions   Coconut  (Cocos Nucifera) Rash   Orange Oil Rash   Pineapple Rash   Prunus Persica Rash   Strawberry Extract Rash and Hives   Tomato Rash   Chocolate Rash and Hives   Citrus Rash and Hives   Dust Mite Extract Hives   Other Hives   Peach Flavoring Agent (Non-Screening) Rash    Allergies as of 05/20/2023       Reactions   Coconut (cocos Nucifera) Rash   Orange Oil Rash   Pineapple Rash   Prunus Persica Rash   Strawberry Extract Rash, Hives   Tomato Rash   Chocolate Rash, Hives   Citrus Rash, Hives   Dust Mite Extract Hives   Other Hives   Peach Flavoring Agent (non-screening) Rash        Medication List        Accurate as of May 20, 2023 11:04 AM. If you have any questions, ask your nurse or doctor.          ASPIRIN 81 PO Take 81 mg by mouth daily.   atorvastatin 80 MG tablet Commonly known as: LIPITOR Take 1 tablet (80 mg total) by mouth daily.   ezetimibe 10 MG tablet Commonly known as: ZETIA Take 1 tablet (10 mg total) by mouth daily.   Farxiga 10 MG Tabs tablet Generic drug: dapagliflozin propanediol Take 1 tablet (10 mg total) by mouth daily before breakfast.   furosemide 20 MG tablet Commonly known as: LASIX Take 1 tablet (20 mg total) by mouth daily.   metoprolol succinate 25 MG 24 hr tablet Commonly known as: TOPROL-XL Take 1 tablet (25 mg total) by mouth daily. Take with or immediately following a meal.   spironolactone 25 MG tablet Commonly known as: ALDACTONE Take 1 tablet (25 mg total) by mouth daily.   valsartan-hydrochlorothiazide 320-12.5 MG tablet Commonly known as: DIOVAN-HCT TAKE 1 TABLET BY MOUTH  DAILY        Review of Systems  Constitutional:  Negative for appetite change, chills, fatigue, fever and unexpected weight change.  HENT:  Negative for congestion, dental problem, ear discharge, ear pain, facial swelling, hearing loss, nosebleeds, postnasal drip, rhinorrhea, sinus pressure, sinus pain, sneezing, sore throat, tinnitus  and trouble swallowing.   Eyes:  Negative for pain, discharge, redness, itching and visual disturbance.  Respiratory:  Negative for cough, chest tightness, shortness of breath and wheezing.   Cardiovascular:  Negative for chest pain, palpitations and leg swelling.  Gastrointestinal:  Negative for abdominal distention, abdominal pain, blood in stool, constipation, diarrhea, nausea and vomiting.  Endocrine: Negative for cold intolerance, heat intolerance, polydipsia, polyphagia and polyuria.  Genitourinary:  Negative for difficulty urinating, dysuria, flank pain, frequency and urgency.  Musculoskeletal:  Negative for arthralgias, back pain, gait problem, joint swelling, myalgias, neck pain and neck stiffness.  Skin:  Negative for color change, pallor, rash and wound.  Neurological:  Negative for dizziness, syncope, speech difficulty, weakness, light-headedness, numbness and headaches.  Hematological:  Does not bruise/bleed easily.  Psychiatric/Behavioral:  Negative for agitation, behavioral problems, confusion, hallucinations, self-injury, sleep disturbance and suicidal ideas. The patient is not nervous/anxious.  Immunization History  Administered Date(s) Administered   Fluad Quad(high Dose 65+) 10/20/2018   Influenza, High Dose Seasonal PF 12/12/2020, 10/30/2021, 11/07/2022   Moderna Covid-19 Fall Seasonal Vaccine 33yrs & older 11/07/2022   Pneumococcal Conjugate-13 10/27/2016   Tdap 10/27/2016   Pertinent  Health Maintenance Due  Topic Date Due   Colonoscopy  Never done   DEXA SCAN  Never done   INFLUENZA VACCINE  09/04/2023      10/30/2018    8:09 AM 05/20/2023    9:51 AM  Fall Risk  Falls in the past year?  0  Was there an injury with Fall?  0  Fall Risk Category Calculator  0  (RETIRED) Patient Fall Risk Level Low fall risk   Patient at Risk for Falls Due to  No Fall Risks  Fall risk Follow up  Falls evaluation completed   Functional Status Survey:    Vitals:    05/20/23 1007  BP: 138/78  Pulse: 63  Resp: 20  Temp: 97.6 F (36.4 C)  SpO2: 97%  Weight: 143 lb 12.8 oz (65.2 kg)  Height: 5\' 5"  (1.651 m)   Body mass index is 23.93 kg/m. Physical Exam MEASUREMENTS: Weight- 143.8. GENERAL: Alert, cooperative, well developed, no acute distress. HEENT: Normocephalic, normal oropharynx, moist mucous membranes, cerumen impaction bilaterally, tympanic membranes not visualized, no frontal sinus tenderness. NECK: Neck normal. CHEST: Clear to auscultation bilaterally, no wheezes, rhonchi, or crackles. CARDIOVASCULAR: Normal heart rate and rhythm, S1 and S2 normal without murmurs. ABDOMEN: Soft, non-tender, non-distended, without organomegaly, normal bowel sounds. EXTREMITIES: No cyanosis or edema, extremities normal. MUSCULOSKELETAL: Crepitus in knees, shoulders normal. NEUROLOGICAL: Cranial nerves grossly intact, moves all extremities without gross motor or sensory deficit.  SKIN: No rash,no lesion or erythema   PSYCHIATRY/BEHAVIORAL: Mood stable    Labs reviewed: Recent Labs    10/29/22 1600 10/30/22 0741 11/12/22 2058 12/10/22 1025 12/30/22 1054 04/01/23 0857  NA 130* 132*   < > 137 136 136  K 4.2 3.3*   < > 3.4* 4.2 4.0  CL 94* 99   < > 94* 100 101  CO2 19* 20*   < > 27 25 29   GLUCOSE 86 73   < > 107* 96 90  BUN 37* 29*   < > 31* 15 34*  CREATININE 2.19* 1.65*   < > 1.54* 0.95 1.53*  CALCIUM 9.3 9.1   < > 9.6 9.5 9.0  MG 1.6* 1.8  --  1.8  --   --   PHOS  --  2.9  --   --   --   --    < > = values in this interval not displayed.   Recent Labs    11/12/22 2058 11/28/22 1155 04/01/23 0857  AST 20 16 16   ALT 16 13 14   ALKPHOS 68 61 69  BILITOT 1.9* 1.7* 0.4  PROT 6.6 6.8 6.8  ALBUMIN 3.3* 3.4* 3.6   Recent Labs    12/18/22 1102 01/13/23 1521 04/01/23 0857  WBC 2.9* 4.6 4.1  NEUTROABS 1.5* 2.5 2.2  HGB 8.0* 8.4* 9.1*  HCT 23.0* 24.4* 26.7*  MCV 82.1 86.5 83.4  PLT 293 308 208   Lab Results  Component Value Date    TSH 0.997 01/13/2023   Lab Results  Component Value Date   HGBA1C 5.5 10/30/2022   Lab Results  Component Value Date   CHOL 235 (H) 04/07/2023   HDL 79 04/07/2023   LDLCALC 142 (H) 04/07/2023   TRIG  83 04/07/2023   CHOLHDL 3.0 04/07/2023    Significant Diagnostic Results in last 30 days:  US THYROID Result Date: 05/07/2023 CLINICAL DATA:  Goiter. Right-sided thyroid nodule previously biopsied in May of 2024 EXAM: THYROID ULTRASOUND TECHNIQUE: Ultrasound examination of the thyroid gland and adjacent soft tissues was performed. COMPARISON:  Prior thyroid ultrasound 05/16/2022 FINDINGS: Parenchymal Echotexture: Mildly heterogenous Isthmus: 0.6 cm Right lobe: 6.6 x 3.3 x 3.6 cm Left lobe: 5.3 x 1.7 x 2.3 cm _________________________________________________________ Estimated total number of nodules >/= 1 cm: 1 Number of spongiform nodules >/=  2 cm not described below (TR1): 0 Number of mixed cystic and solid nodules >/= 1.5 cm not described below (TR2): 0 _________________________________________________________ Nodule # 2: The previously biopsied lesion occupying the majority of the right mid and lower gland is grossly unchanged at 4.9 x 3.5 x 3.3 cm. No other thyroid nodules of consequence are identified that would warrant further consideration. IMPRESSION: Stable appearance of previously biopsied nodule in the right mid to lower gland. The above is in keeping with the ACR TI-RADS recommendations - J Am Coll Radiol 2017;14:587-595. Electronically Signed   By: Malachy Moan M.D.   On: 05/07/2023 08:48   LONG TERM MONITOR (3-14 DAYS) Result Date: 05/06/2023 Zio patch monitor 13 days 04/14/2023 - 04/27/2022: Dominant rhythm: Sinus. HR 47-121 bpm. Avg HR 62 bpm, in sinus rhythm. 8 episodes of SVT/atrial tachycardia, fastest at 165 bpm for 5 beats, longest for 15 beats at 115 bpm. <1% isolated SVE,  couplet/triplets. 0 episodes of VT. <1% isolated VE, no couplet/triplets. No atrial fibrillation/atrial  flutter/VT/high grade AV block, sinus pause >3sec noted. 53 patient triggered events, correlated with sinus rhythm artifact, without any significant arrhythmia.   ECHOCARDIOGRAM COMPLETE Result Date: 04/28/2023    ECHOCARDIOGRAM REPORT   Patient Name:   Dana Beltran    Date of Exam: 04/28/2023 Medical Rec #:  [id number]     Height:       65.0 in Accession #:    [id number]    Weight:       150.5 lb Date of Birth:  September 09, 1945     BSA:          1.753 m Patient Age:    77 years      BP:           160/83 mmHg Patient Gender: F             HR:           57 bpm. Exam Location:  [address] Procedure: 2D Echo, Cardiac Doppler and Color Doppler (Both Spectral and Color            Flow Doppler were utilized during procedure). Indications:    I31.3 Pericardial Effusion  History:        Patient has prior history of Echocardiogram examinations, most                 recent 10/30/2022. CAD and Previous Myocardial Infarction; Risk                 Factors:Hypertension and Diabetes. Lower extremity edema.  Sonographer:    Daphine Deutscher RDCS Referring Phys: [id number] [facility] [name] IMPRESSIONS  1. Left ventricular ejection fraction, by estimation, is 55 to 60%. The left ventricle has normal function. The left ventricle has no regional wall motion abnormalities. Left ventricular diastolic parameters are consistent with Grade I diastolic dysfunction (impaired relaxation).  2. Right ventricular systolic function is normal. The right  ventricular size is normal.  3. Left atrial size was mildly dilated.  4. The mitral valve is normal in structure. Trivial mitral valve regurgitation. No evidence of mitral stenosis.  5. Nodular calcification of the noncoronary cusp. The aortic valve is tricuspid. Aortic valve regurgitation is not visualized. No aortic stenosis is present.  6. Moderate pericardial effusion. The pericardial effusion is circumferential. There is no evidence of cardiac tamponade. Similar to prior echo and cardiac MRI.  FINDINGS  Left Ventricle: Left ventricular ejection fraction, by estimation, is 55 to 60%. The left ventricle has normal function. The left ventricle has no regional wall motion abnormalities. The left ventricular internal cavity size was normal in size. There is  no left ventricular hypertrophy. Left ventricular diastolic parameters are consistent with Grade I diastolic dysfunction (impaired relaxation). Right Ventricle: The right ventricular size is normal. No increase in right ventricular wall thickness. Right ventricular systolic function is normal. Left Atrium: Left atrial size was mildly dilated. Right Atrium: Right atrial size was normal in size. Pericardium: A moderately sized pericardial effusion is present. The pericardial effusion is circumferential. There is no evidence of cardiac tamponade. Mitral Valve: The mitral valve is normal in structure. Mild mitral annular calcification. Trivial mitral valve regurgitation. No evidence of mitral valve stenosis. Tricuspid Valve: The tricuspid valve is normal in structure. Tricuspid valve regurgitation is mild. Aortic Valve: Nodular calcification of the noncoronary cusp. The aortic valve is tricuspid. Aortic valve regurgitation is not visualized. No aortic stenosis is present. Pulmonic Valve: The pulmonic valve was normal in structure. Pulmonic valve regurgitation is trivial. Aorta: The aortic root is normal in size and structure. IAS/Shunts: No atrial level shunt detected by color flow Doppler.  LEFT VENTRICLE PLAX 2D LVIDd:         4.50 cm   Diastology LVIDs:         2.50 cm   LV e' medial:    7.18 cm/s LV PW:         1.20 cm   LV E/e' medial:  10.4 LV IVS:        0.80 cm   LV e' lateral:   7.94 cm/s LVOT diam:     1.70 cm   LV E/e' lateral: 9.4 LV SV:         68 LV SV Index:   39 LVOT Area:     2.27 cm  RIGHT VENTRICLE             IVC RV Basal diam:  3.70 cm     IVC diam: 1.00 cm RV S prime:     16.45 cm/s TAPSE (M-mode): 1.8 cm LEFT ATRIUM             Index         RIGHT ATRIUM           Index LA diam:        4.90 cm 2.80 cm/m   RA Area:     15.10 cm LA Vol (A2C):   63.5 ml 36.22 ml/m  RA Volume:   36.60 ml  20.88 ml/m LA Vol (A4C):   68.6 ml 39.13 ml/m LA Biplane Vol: 67.2 ml 38.34 ml/m  AORTIC VALVE LVOT Vmax:   114.50 cm/s LVOT Vmean:  80.900 cm/s LVOT VTI:    0.302 m  AORTA Ao Root diam: 2.90 cm Ao Asc diam:  3.30 cm MITRAL VALVE               TRICUSPID VALVE MV Area (  PHT): 3.05 cm    TR Peak grad:   36.0 mmHg MV Decel Time: 249 msec    TR Vmax:        300.00 cm/s MV E velocity: 74.40 cm/s MV A velocity: 94.35 cm/s  SHUNTS MV E/A ratio:  0.79        Systemic VTI:  0.30 m                            Systemic Diam: 1.70 cm Dalton McleanMD Electronically signed by Archer Bear Signature Date/Time: 04/28/2023/8:09:33 AM    Final     Assessment/Plan  Congestive Heart Failure Chronic heart failure with left ventricular narrowing, reduced blood flow, and fluid accumulation. Symptoms include palpitations and exertional dyspnea. She is under cardiology care and on multiple medications. Emphasis on fluid management and weight monitoring to prevent overload. She aims to resume regular activities, contingent on symptom management. - Continue current medications including diuretics for fluid management - Monitor weight regularly to detect fluid retention - Follow low-sodium diet to prevent fluid retention - Wear compression stockings daily to manage leg swelling - Follow up with cardiologist at the end of the month for further evaluation  Hypertension Hypertension is well-controlled with valsartan, hydrochlorothiazide, metoprolol, and spironolactone. Home blood pressure readings are around 125/60 mmHg, within target range.  Chronic Kidney Disease Chronic kidney disease with creatinine levels at 1.53 mg/dL. Condition is monitored regularly, and diuretics may affect kidney function.  Anemia Chronic anemia with hemoglobin levels improving from 8.0 to 9.1  g/dL. Specific type undetermined, but thalassemia and sickle cell excluded. Under hematology care for further evaluation.  Hyperlipidemia Hyperlipidemia with recent cholesterol increase, likely due to dietary changes. Total cholesterol is 235 mg/dL and LDL is 161 mg/dL. She acknowledges dietary indiscretions and plans to return to a healthier diet. - Adopt a low-saturated fat diet to manage cholesterol levels  Vaginal Bleeding Recent episode of vaginal bleeding resolved spontaneously. Fibroids may contribute to anemia and bleeding. No current abdominal pain or other symptoms.  General Health Maintenance Actively managing multiple chronic conditions with regular specialist follow-ups. Aware of lifestyle modifications' importance. - Encourage regular follow-up with specialists and primary care provider - Advise on lifestyle modifications including diet and exercise to manage chronic conditions  Follow-up Regular follow-up necessary to monitor chronic conditions and adjust treatment. Scheduled to see cardiologist and primary care provider in coming months. - Schedule follow-up appointment in four months to monitor chronic conditions - Ensure records from specialists are sent to primary care provider for comprehensive care    Family/ staff Communication: Reviewed plan of care with patient verbalized understanding   Labs/tests ordered: None   Next Appointment : Return in about 4 months (around 09/19/2023) for medical mangement of chronic issues.Aaron Aas   Spent 35 minutes of Face to face and non-face to face with patient  >50% time spent counseling; reviewing medical record; tests; labs; documentation and developing future plan of care.   Estil Heman, NP

## 2023-05-28 ENCOUNTER — Other Ambulatory Visit: Payer: Self-pay | Admitting: Hematology

## 2023-05-28 DIAGNOSIS — D500 Iron deficiency anemia secondary to blood loss (chronic): Secondary | ICD-10-CM

## 2023-05-28 DIAGNOSIS — D509 Iron deficiency anemia, unspecified: Secondary | ICD-10-CM | POA: Insufficient documentation

## 2023-06-03 ENCOUNTER — Other Ambulatory Visit: Payer: Self-pay | Admitting: Hematology

## 2023-06-03 ENCOUNTER — Telehealth: Payer: Self-pay

## 2023-06-03 NOTE — Telephone Encounter (Signed)
 Dr. Salomon Cree and [facility], patient will be scheduled as soon as possible.  Auth Submission: NO AUTH NEEDED Site of care: Site of care: [facility] Payer: [employer] medicare Medication & CPT/J Code(s) submitted: Venofer (Iron Sucrose) J1756 Route of submission (phone, fax, portal):  Phone # Fax # Auth type: Buy/Bill PB Units/visits requested: 300mg  x 3 doses Reference number:  Approval from: 06/03/23 to 11/03/23

## 2023-06-09 ENCOUNTER — Encounter: Payer: Self-pay | Admitting: Hematology

## 2023-06-09 ENCOUNTER — Ambulatory Visit (INDEPENDENT_AMBULATORY_CARE_PROVIDER_SITE_OTHER): Payer: Medicare (Managed Care)

## 2023-06-09 VITALS — BP 142/95 | HR 62 | Temp 98.1°F | Resp 16 | Ht 62.0 in | Wt 145.2 lb

## 2023-06-09 DIAGNOSIS — D500 Iron deficiency anemia secondary to blood loss (chronic): Secondary | ICD-10-CM

## 2023-06-09 DIAGNOSIS — D649 Anemia, unspecified: Secondary | ICD-10-CM

## 2023-06-09 MED ORDER — SODIUM CHLORIDE 0.9 % IV SOLN
300.0000 mg | Freq: Once | INTRAVENOUS | Status: AC
  Administered 2023-06-09: 300 mg via INTRAVENOUS
  Filled 2023-06-09: qty 15

## 2023-06-09 NOTE — Patient Instructions (Signed)

## 2023-06-09 NOTE — Progress Notes (Signed)
 Diagnosis: Iron Deficiency Anemia  Provider:  Praveen Mannam MD  Procedure: IV Infusion  IV Type: Peripheral, IV Location: R Forearm  Venofer (Iron Sucrose), Dose: 300 mg  Infusion Start Time: 0907  Infusion Stop Time: 1050  Post Infusion IV Care: Observation period completed and Peripheral IV Discontinued  Discharge: Condition: Good, Destination: Home . AVS Declined  Performed by:  Hillard Goodwine, RN

## 2023-06-16 ENCOUNTER — Ambulatory Visit (INDEPENDENT_AMBULATORY_CARE_PROVIDER_SITE_OTHER): Payer: Medicare (Managed Care)

## 2023-06-16 VITALS — BP 182/92 | HR 69 | Temp 98.3°F | Resp 16 | Ht 62.0 in | Wt 138.8 lb

## 2023-06-16 DIAGNOSIS — D649 Anemia, unspecified: Secondary | ICD-10-CM | POA: Diagnosis not present

## 2023-06-16 DIAGNOSIS — D500 Iron deficiency anemia secondary to blood loss (chronic): Secondary | ICD-10-CM

## 2023-06-16 MED ORDER — SODIUM CHLORIDE 0.9 % IV SOLN
300.0000 mg | Freq: Once | INTRAVENOUS | Status: AC
  Administered 2023-06-16: 300 mg via INTRAVENOUS
  Filled 2023-06-16: qty 15

## 2023-06-16 NOTE — Progress Notes (Signed)
 Diagnosis: Iron  Deficiency Anemia  Provider:  Praveen Mannam MD  Procedure: IV Infusion  IV Type: Peripheral, IV Location: R Forearm  Venofer  (Iron  Sucrose), Dose: 300 mg  Infusion Start Time: 0902  Infusion Stop Time: 1040  Post Infusion IV Care: Observation period completed and Peripheral IV Discontinued pt asked for 15 min obs  Discharge: Condition: Good, Destination: Home . AVS Declined  Performed by:  Dondra Rhett, RN

## 2023-06-22 ENCOUNTER — Encounter: Payer: Self-pay | Admitting: Hematology

## 2023-06-23 ENCOUNTER — Telehealth: Payer: Self-pay | Admitting: Hematology

## 2023-06-23 ENCOUNTER — Ambulatory Visit: Payer: Medicare (Managed Care)

## 2023-06-23 VITALS — BP 158/84 | HR 71 | Temp 98.4°F | Resp 20 | Ht 62.0 in | Wt 133.2 lb

## 2023-06-23 DIAGNOSIS — D500 Iron deficiency anemia secondary to blood loss (chronic): Secondary | ICD-10-CM

## 2023-06-23 NOTE — Telephone Encounter (Signed)
 The patient called to move her lab appointment up earlier in the day. She requested that her appointments be hours apart due to a conflict. She insisted on scheduling this way.

## 2023-06-23 NOTE — Progress Notes (Signed)
 Unable to get a IV access. Pt requested to reschedule

## 2023-06-25 ENCOUNTER — Telehealth: Payer: Self-pay | Admitting: Pharmacy Technician

## 2023-06-25 ENCOUNTER — Encounter: Payer: Self-pay | Admitting: Family

## 2023-06-25 ENCOUNTER — Encounter: Payer: Self-pay | Admitting: Hematology

## 2023-06-25 ENCOUNTER — Ambulatory Visit (INDEPENDENT_AMBULATORY_CARE_PROVIDER_SITE_OTHER): Admitting: Family

## 2023-06-25 ENCOUNTER — Ambulatory Visit: Payer: Self-pay

## 2023-06-25 VITALS — BP 142/82 | HR 63 | Temp 97.7°F | Resp 18 | Ht 62.0 in | Wt 133.2 lb

## 2023-06-25 DIAGNOSIS — R519 Headache, unspecified: Secondary | ICD-10-CM | POA: Diagnosis not present

## 2023-06-25 DIAGNOSIS — M25561 Pain in right knee: Secondary | ICD-10-CM | POA: Diagnosis not present

## 2023-06-25 DIAGNOSIS — M25551 Pain in right hip: Secondary | ICD-10-CM

## 2023-06-25 DIAGNOSIS — G8929 Other chronic pain: Secondary | ICD-10-CM | POA: Diagnosis not present

## 2023-06-25 DIAGNOSIS — I10 Essential (primary) hypertension: Secondary | ICD-10-CM | POA: Diagnosis not present

## 2023-06-25 DIAGNOSIS — K068 Other specified disorders of gingiva and edentulous alveolar ridge: Secondary | ICD-10-CM | POA: Diagnosis not present

## 2023-06-25 DIAGNOSIS — R0781 Pleurodynia: Secondary | ICD-10-CM | POA: Diagnosis not present

## 2023-06-25 MED ORDER — ACETAMINOPHEN 500 MG PO TABS: 500.0000 mg | ORAL_TABLET | Freq: Three times a day (TID) | ORAL | 3 refills | Status: DC | PRN

## 2023-06-25 NOTE — Telephone Encounter (Signed)
 Dana Blood, RN  [facility]9 minutes ago (9:49 AM)    [name], thanks.      Patient has an appointment scheduled for 06/25/2023 with Dinah.

## 2023-06-25 NOTE — Telephone Encounter (Signed)
 Copied from CRM [id number]. Topic: Clinical - Red Word Triage >> Jun 25, 2023  9:31 AM Danelle Dunning F wrote: [facility] that prompted transfer to Nurse Triage:   Fainted yesterday around 7pm; 1am patient vomited   Chief Complaint: Fainted at home yesterday.Sitting in her chair doing leg exercises and woke up on the floor. Has not been eating and drinking well due to dental issues. Was alone. Symptoms: right knee sore Frequency: yesterday Pertinent Negatives: Patient denies  Disposition: [] ED /[] Urgent Care (no appt availability in office) / [x] Appointment(In office/virtual)/ []  [facility]/ [] Home Care/ [] Refused Recommended Disposition /[] [facility]/ []  Follow-up with PCP Additional Notes: agrees with appointment.  Reason for Disposition  [1] All other patients AND [2] now alert and feels fine  (Exception: SIMPLE FAINT due to stress, pain, prolonged standing, or suddenly standing)  Answer Assessment - Initial Assessment Questions 1. ONSET: "How long were you unconscious?" (minutes) "When did it happen?"     Last night at home 2. CONTENT: "What happened during period of unconsciousness?" (e.g., seizure activity)      Woke up on the floor 3. MENTAL STATUS: "Alert and oriented now?" (oriented x 3 = name, month, location)      Alert now 4. TRIGGER: "What do you think caused the fainting?" "What were you doing just before you fainted?"  (e.g., exercise, sudden standing up, prolonged standing)     Sitting doing leg exercises  5. RECURRENT SYMPTOM: "Have you ever passed out before?" If Yes, ask: "When was the last time?" and "What happened that time?"      no 6. INJURY: "Did you sustain any injury during the fall?"      Right knee 7. CARDIAC SYMPTOMS: "Have you had any of the following symptoms: chest pain, difficulty breathing, palpitations?"     yes 8. NEUROLOGIC SYMPTOMS: "Have you had any of the following symptoms: headache, numbness, vertigo, weakness?"     no 9. GI  SYMPTOMS: "Have you had any of the following symptoms: abdomen pain, vomiting, diarrhea, blood in stools?"     no 10. OTHER SYMPTOMS: "Do you have any other symptoms?"       no 11. PREGNANCY: "Is there any chance you are pregnant?" "When was your last menstrual period?"       no  Protocols used: Fainting-A-AH

## 2023-06-25 NOTE — Patient Instructions (Addendum)
-   Please get X-ray at [facility] imaging at [facility] then will call you with results. -   Instill debrox 6.5 otic solution 5 drops into each ear twice daily x 4 days then follow up for ear lavage.May apply cotton ball at bedtime to prevent drainage to pillow.

## 2023-06-25 NOTE — Telephone Encounter (Signed)
 Auth Submission: NO AUTH NEEDED Site of care: Site of care: [facility] Payer: [employer] MEDICARE NEW INSURANCE - CHANGED FROM [employer] MEDICARE  Medication & CPT/J Code(s) submitted: Venofer  (Iron  Sucrose) J1756 Route of submission (phone, fax, portal):  Phone # Fax # Auth type: Buy/Bill PB Units/visits requested: VENOFER  Reference number:  Approval from: 06/25/23 to 02/03/24

## 2023-06-26 ENCOUNTER — Telehealth: Payer: Self-pay | Admitting: Family

## 2023-06-26 NOTE — Telephone Encounter (Signed)
 May discontinue Zinc if causing vomiting.

## 2023-06-26 NOTE — Telephone Encounter (Signed)
 Copied from CRM [id number]. Topic: Clinical - Request for Lab/Test Order >> Jun 25, 2023  5:01 PM [name] wrote: Reason for CRM: The patient reviewed her After Summary Notes for her Acute visit with NP Dinah, she states she does not want to undergo an X-ray on her hip,knee, or rib because there's no fractures. She is requesting to not be scheduled for imaging for this reason.  Called patient left voicemail to callback about imaging

## 2023-06-26 NOTE — Telephone Encounter (Signed)
 Copied from CRM [id number]. Topic: Clinical - Medical Advice >> Jun 26, 2023  9:03 AM Danelle Dunning F wrote: Reason for CRM:   Patient came in for an appointment yesterday on 06/25/2023 for fainting and vomiting; Patient stated she never got definite answers in regards to why she fainted; Month ago patient was informed the reason she passed out was due to her blood pressure but she was never able to confirm the cause of this fainting episode; Please contact the patient to relay reason for the fainting.   Callback Number: [phone]

## 2023-06-26 NOTE — Telephone Encounter (Signed)
 Copied from CRM [id number]. Topic: Clinical - Request for Lab/Test Order >> Jun 25, 2023  5:01 PM [name] wrote: Reason for CRM: The patient reviewed her After Summary Notes for her Acute visit with NP Dinah, she states she does not want to undergo an X-ray on her hip,knee, or rib because there's no fractures. She is requesting to not be scheduled for imaging for this reason.  See Triage Notes from Triage Nurse. Please Advise    Message sent to [name], Dinah C, NP

## 2023-06-26 NOTE — Telephone Encounter (Signed)
 X-ray is walk in.No schedule required.

## 2023-06-26 NOTE — Telephone Encounter (Signed)
 Patient stated that her knee is feeling better, she does not want to do the x-ray and that she wanted to know why is she vomiting because she think it is the Zinc that she taking for her tooth and wanted to know what is the cause.   Message sent to [name], Dinah C, NP

## 2023-06-26 NOTE — Telephone Encounter (Signed)
 Left message on voicemail for patient to return call when available.  Will forward message to Jasmine Dillard, CMA who will cover clinical intake on Tuesday  Message sent  [place], CMA

## 2023-06-30 NOTE — Telephone Encounter (Signed)
 Noted. Thank You.

## 2023-06-30 NOTE — Telephone Encounter (Signed)
 Patient called and notified. She states that she stopped taking Zinc and she's not longer having this issue. Patient wanted me to tell you she said "Thank you". Message routed to PCP [name], Elijio Guadeloupe, NP

## 2023-07-01 ENCOUNTER — Ambulatory Visit (INDEPENDENT_AMBULATORY_CARE_PROVIDER_SITE_OTHER)

## 2023-07-01 VITALS — BP 146/84 | HR 56 | Temp 97.8°F | Resp 18 | Ht 62.0 in | Wt 134.8 lb

## 2023-07-01 DIAGNOSIS — D649 Anemia, unspecified: Secondary | ICD-10-CM

## 2023-07-01 DIAGNOSIS — D500 Iron deficiency anemia secondary to blood loss (chronic): Secondary | ICD-10-CM

## 2023-07-01 MED ORDER — SODIUM CHLORIDE 0.9 % IV SOLN
300.0000 mg | Freq: Once | INTRAVENOUS | Status: AC
  Administered 2023-07-01: 300 mg via INTRAVENOUS
  Filled 2023-07-01: qty 15

## 2023-07-01 NOTE — Progress Notes (Signed)
 Diagnosis: Iron  Deficiency Anemia  Provider:  Praveen Mannam MD  Procedure: IV Infusion  IV Type: Peripheral, IV Location: R Hand  Venofer  (Iron  Sucrose), Dose: 300 mg  Infusion Start Time: 1037  Infusion Stop Time: 1215  Post Infusion IV Care: Patient declined observation and Peripheral IV Discontinued  Discharge: Condition: Good, Destination: Home . AVS Declined  Performed by:  Maryclaire Stoecker, RN

## 2023-07-05 NOTE — Progress Notes (Signed)
 Provider: Christean Courts FNP-C  Gavina Dildine, Elijio Guadeloupe, NP  Patient Care Team: Azion Centrella, Elijio Guadeloupe, NP as PCP - General (Family Medicine) Cody Das, MD as PCP - Cardiology (Cardiology) Frankie Israel, MD as Consulting Physician (Hematology)  Extended Emergency Contact Information Primary Emergency Contact: Mills,Diana Home Phone: [phone] Mobile Phone: [phone] Relation: Friend Interpreter needed? No Secondary Emergency Contact: [facility] Phone: [phone] Mobile Phone: [phone] Relation: Relative  Code Status:  Full Code  Goals of care: Advanced Directive information    06/23/2023    8:41 AM  Advanced Directives  Does Patient Have a Medical Advance Directive? No  Would patient like information on creating a medical advance directive? No - Patient declined     Chief Complaint  Patient presents with   Acute Visit    Fainted yesterday    Discussed the use of AI scribe software for clinical note transcription with the patient, who gave verbal consent to proceed.  History of Present Illness   Dana Beltran is a 78 year old female who presents with right-sided pain and swelling following a fall.  She fainted while exercising in her chair and found herself on the floor, lying on her side. She does not recall the duration of unconsciousness. The fall resulted in swelling and pain in her right knee, as well as soreness in her right side, face, neck, and thigh. The knee is very painful, especially when walking, and is bruised and swollen.  She vomited at 1 AM, describing the vomitus as pink, which she attributes to her last meal of shrimp and a vitamin C supplement containing orange, to which she is allergic. She also mentions taking zinc, which her brother suggested might cause vomiting.  No abdominal pain, burning or itching during urination, or pain when taking a deep breath. She has not had much of an appetite recently, which she attributes to  gum pain that has made it difficult to eat or drink. She has seen a dentist for her gum issues and has been prescribed medication.  Her blood pressure at home has not been monitored recently due to the pain, but she acknowledges it is usually higher when in pain. She is currently using a cane for mobility.   Past Medical History:  Diagnosis Date   Allergy 1959   Tested at 12 years   Anemia 2025   Tested positive   Arthritis 2024   Tested   Cataract disorder type 14    CHF (congestive heart failure) (HCC) 2016   Heart Attack   Coronary artery disease    Coronary artery disease    Diabetes mellitus without complication (HCC)    Hypertension    Hypovolemia    Leg edema, left    after door fell on her at work    MI, old    Mixed hyperlipidemia    Snoring    Past Surgical History:  Procedure Laterality Date   CARDIAC SURGERY     Stent placement x4   EYE SURGERY  2023   [place]ack   pericardial effusion      Allergies  Allergen Reactions   Coconut (Cocos Nucifera) Rash   Orange Oil Rash   Pineapple Rash   Prunus Persica Rash   Strawberry Extract Rash and Hives   Tomato Rash   Chocolate Rash and Hives   Citrus Rash and Hives   Dust Mite Extract Hives   Other Hives   Peach Flavoring Agent (Non-Screening) Rash    Outpatient Encounter  Medications as of 06/25/2023  Medication Sig   acetaminophen  (TYLENOL ) 500 MG tablet Take 1 tablet (500 mg total) by mouth every 8 (eight) hours as needed.   Ascorbic Acid (VITAMIN C) 1000 MG tablet Take 1,000 mg by mouth daily. Take 2 tablets daily   ASPIRIN  81 PO Take 81 mg by mouth daily.   atorvastatin  (LIPITOR) 80 MG tablet Take 1 tablet (80 mg total) by mouth daily.   ezetimibe  (ZETIA ) 10 MG tablet Take 1 tablet (10 mg total) by mouth daily.   FARXIGA  10 MG TABS tablet Take 1 tablet (10 mg total) by mouth daily before breakfast.   furosemide  (LASIX ) 20 MG tablet Take 1 tablet (20 mg total) by mouth daily.   metoprolol  succinate  (TOPROL -XL) 25 MG 24 hr tablet Take 1 tablet (25 mg total) by mouth daily. Take with or immediately following a meal.   spironolactone  (ALDACTONE ) 25 MG tablet Take 1 tablet (25 mg total) by mouth daily.   UNABLE TO FIND Take 1 Bottle by mouth as directed. Med Name: Magic Mouthwash   1 part 2% viscous lidocaine, 1 part maalox, 1 part diphenhydramine , 1 part nystatin suspension  Rinse mouth with capful after meals and when pain is present PRN.   valsartan-hydrochlorothiazide (DIOVAN-HCT) 320-12.5 MG tablet TAKE 1 TABLET BY MOUTH  DAILY   Zinc 100 MG TABS Take by mouth daily. 2 tablets once daily   [DISCONTINUED] magic mouthwash SOLN Take 5 mLs by mouth. Suspension contains equal amounts of Maalox Extra Strength, nystatin, and diphenhydramine .   No facility-administered encounter medications on file as of 06/25/2023.    Review of Systems  Constitutional:  Negative for appetite change, chills, fatigue, fever and unexpected weight change.  HENT:  Positive for dental problem. Negative for congestion, ear discharge, ear pain, facial swelling, hearing loss, nosebleeds, postnasal drip, rhinorrhea, sinus pressure, sinus pain, sneezing, sore throat, tinnitus and trouble swallowing.        Gum pain ,facial and neck soreness following up with dentist   Eyes:  Negative for pain, discharge, redness, itching and visual disturbance.  Respiratory:  Negative for cough, chest tightness, shortness of breath and wheezing.        Right rib pain   Cardiovascular:  Negative for chest pain, palpitations and leg swelling.  Gastrointestinal:  Negative for abdominal distention, abdominal pain, blood in stool, constipation, diarrhea, nausea and vomiting.  Musculoskeletal:  Positive for arthralgias. Negative for back pain, gait problem, joint swelling, myalgias and neck stiffness.       Right knee pain and swelling   Skin:  Negative for color change, pallor, rash and wound.  Neurological:  Negative for dizziness, speech  difficulty, weakness, light-headedness, numbness and headaches.  Hematological:  Does not bruise/bleed easily.    Immunization History  Administered Date(s) Administered   Fluad Quad(high Dose 65+) 10/20/2018   Influenza, High Dose Seasonal PF 12/12/2020, 10/30/2021, 11/07/2022   Moderna Covid-19 Fall Seasonal Vaccine 63yrs & older 11/07/2022   Pneumococcal Conjugate-13 10/27/2016   Tdap 10/27/2016   Pertinent  Health Maintenance Due  Topic Date Due   Colonoscopy  Never done   DEXA SCAN  Never done   INFLUENZA VACCINE  09/04/2023      10/30/2018    8:09 AM 05/20/2023    9:51 AM 06/09/2023    8:30 AM 06/23/2023    8:41 AM  Fall Risk  Falls in the past year?  0 0 0  Was there an injury with Fall?  0 0  Fall Risk Category Calculator  0    (RETIRED) Patient Fall Risk Level Low fall risk     Patient at Risk for Falls Due to  No Fall Risks  No Fall Risks  Fall risk Follow up  Falls evaluation completed  Falls evaluation completed   Functional Status Survey:    Vitals:   06/25/23 1519  BP: (!) 142/82  Pulse: 63  Resp: 18  Temp: 97.7 F (36.5 C)  SpO2: 99%  Weight: 133 lb 3.2 oz (60.4 kg)  Height: 5\' 2"  (1.575 m)   Body mass index is 24.36 kg/m. Physical Exam VITALS: T- 97.7, P- 63, BP- 142/82, SaO2- 99% GENERAL: Alert, cooperative, well developed, no acute distress. HEENT: Normocephalic, normal oropharynx, moist mucous membranes, ears and nose normal. NECK: Neck normal, right side tender, no bruising. CHEST: Clear to auscultation bilaterally, no wheezes, rhonchi, or crackles.right rib cage pain tender to palpation without any erythema,bruise or swelling  CARDIOVASCULAR: Normal heart rate and rhythm, S1 and S2 normal without murmurs. ABDOMEN: Soft, non-tender, non-distended, without organomegaly, normal bowel sounds. EXTREMITIES: No cyanosis or edema. MUSCULOSKELETAL: Right knee swollen, not red, right side of face tender, no bruising. NEUROLOGICAL: Cranial nerves  grossly intact, moves all extremities without gross motor or sensory deficit.   Labs reviewed: Recent Labs    10/29/22 1600 10/30/22 0741 11/12/22 2058 12/10/22 1025 12/30/22 1054 04/01/23 0857  NA 130* 132*   < > 137 136 136  K 4.2 3.3*   < > 3.4* 4.2 4.0  CL 94* 99   < > 94* 100 101  CO2 19* 20*   < > 27 25 29   GLUCOSE 86 73   < > 107* 96 90  BUN 37* 29*   < > 31* 15 34*  CREATININE 2.19* 1.65*   < > 1.54* 0.95 1.53*  CALCIUM  9.3 9.1   < > 9.6 9.5 9.0  MG 1.6* 1.8  --  1.8  --   --   PHOS  --  2.9  --   --   --   --    < > = values in this interval not displayed.   Recent Labs    11/12/22 2058 11/28/22 1155 04/01/23 0857  AST 20 16 16   ALT 16 13 14   ALKPHOS 68 61 69  BILITOT 1.9* 1.7* 0.4  PROT 6.6 6.8 6.8  ALBUMIN  3.3* 3.4* 3.6   Recent Labs    12/18/22 1102 01/13/23 1521 04/01/23 0857  WBC 2.9* 4.6 4.1  NEUTROABS 1.5* 2.5 2.2  HGB 8.0* 8.4* 9.1*  HCT 23.0* 24.4* 26.7*  MCV 82.1 86.5 83.4  PLT 293 308 208   Lab Results  Component Value Date   TSH 0.997 01/13/2023   Lab Results  Component Value Date   HGBA1C 5.5 10/30/2022   Lab Results  Component Value Date   CHOL 235 (H) 04/07/2023   HDL 79 04/07/2023   LDLCALC 142 (H) 04/07/2023   TRIG 83 04/07/2023   CHOLHDL 3.0 04/07/2023    Significant Diagnostic Results in last 30 days:  No results found.  Assessment/Plan  Knee pain and swelling Acute right knee pain and swelling following a fall. The knee is swollen without erythema. Differential includes effusion, arthritis, or trauma-related impact. Imaging is required to ascertain the etiology. - Order [facility] of the right knee at [facility] - Advise acetaminophen  500 mg three times daily for analgesia - Recommend use of a cane for ambulation support  Right rib cage pain  right rib cage pain tender to palpation without any erythema,bruise or swelling  Will obtain imaging right rib   Facial soreness Right-sided facial soreness  post-fall. No ecchymosis observed. Tenderness on palpation.  Neck soreness Right-sided neck soreness post-fall. No ecchymosis observed. Tenderness on palpation.  Hypertension Blood pressure is slightly elevated at 142/82, likely secondary to pain from the fall. Heart rate is 63 bpm, temperature is 97.53F, and oxygen saturation is 99%.  Gum pain Gum pain reported, unrelated to dental issues. She has consulted a dentist and received treatment.  General Health Maintenance Discussed the need for a home health aide and the requirement of a physician's letter for approval. - Provide a letter for home health aide approval   Family/ staff Communication: Reviewed plan of care with patient verbalized understanding   Labs/tests ordered:  - Dg right hip - Dg knee  - Dg right rib cage   Next Appointment: No follow-ups on file.  Spent 30 minutes of Face to face and non-face to face with patient  >50% time spent counseling; reviewing medical record; tests; labs; and developing future plan of care.  Estil Heman, NP

## 2023-07-07 ENCOUNTER — Other Ambulatory Visit: Payer: Medicare (Managed Care)

## 2023-07-07 ENCOUNTER — Ambulatory Visit: Payer: Medicare (Managed Care) | Admitting: Hematology

## 2023-07-09 ENCOUNTER — Ambulatory Visit: Payer: Self-pay | Admitting: *Deleted

## 2023-07-09 NOTE — Telephone Encounter (Signed)
  Reason for Disposition  Mild localized rash  Answer Assessment - Initial Assessment Questions Patient thinks she may be having allergic reaction to Zetia - but she started medication over 1 month ago and he symptoms started yesterday. Virtual visit scheduled to discuss her symptoms and address her concerns.   1. APPEARANCE of RASH: "Describe the rash."      Red, one large area- swelling 2. LOCATION: "Where is the rash located?"      On side-left 3. NUMBER: "How many spots are there?"      na 4. SIZE: "How big are the spots?" (Inches, centimeters or compare to size of a coin)      Size of quarter 5. ONSET: "When did the rash start?"      2 days 6. ITCHING: "Does the rash itch?" If Yes, ask: "How bad is the itch?"  (Scale 0-10; or none, mild, moderate, severe)     yes 7. PAIN: "Does the rash hurt?" If Yes, ask: "How bad is the pain?"  (Scale 0-10; or none, mild, moderate, severe)    - NONE (0): no pain    - MILD (1-3): doesn't interfere with normal activities     - MODERATE (4-7): interferes with normal activities or awakens from sleep     - SEVERE (8-10): excruciating pain, unable to do any normal activities     no 8. OTHER SYMPTOMS: "Do you have any other symptoms?" (e.g., fever)     Cough, nasal drainage  Protocols used: Rash or Redness - Localized-A-AH    Copied from CRM [id number]. Topic: Clinical - Red Word Triage >> Jul 09, 2023  4:18 PM Julie Oddi wrote: Red Word that prompted transfer to Nurse Triage: Allergic reaction to ezetimibe  (ZETIA ) 10 MG tablet

## 2023-07-09 NOTE — Telephone Encounter (Signed)
 FYI Only or Action Required?: FYI only for provider  Patient was last seen in primary care on 06/25/2023 by [name], Elijio Guadeloupe, NP. Called Nurse Triage reporting Rash. Symptoms began yesterday. Interventions attempted: OTC medications: alcohol. Symptoms are: unchanged.  Triage Disposition: Home Care  Patient/caregiver understands and will follow disposition?: yes- patient states she thinks her symptoms are related to medication- virtual visit scheduled due to transportation issues.

## 2023-07-09 NOTE — Telephone Encounter (Signed)
 See Triage Notes from Triage Nurse FYI patient has appointment on MyChart.   Message sent to [facility], NP

## 2023-07-10 ENCOUNTER — Telehealth: Admitting: Adult Health

## 2023-07-10 ENCOUNTER — Encounter: Payer: Self-pay | Admitting: Adult Health

## 2023-07-10 DIAGNOSIS — E782 Mixed hyperlipidemia: Secondary | ICD-10-CM

## 2023-07-10 DIAGNOSIS — T887XXA Unspecified adverse effect of drug or medicament, initial encounter: Secondary | ICD-10-CM

## 2023-07-10 DIAGNOSIS — I5032 Chronic diastolic (congestive) heart failure: Secondary | ICD-10-CM

## 2023-07-10 DIAGNOSIS — D500 Iron deficiency anemia secondary to blood loss (chronic): Secondary | ICD-10-CM | POA: Diagnosis not present

## 2023-07-10 DIAGNOSIS — I10 Essential (primary) hypertension: Secondary | ICD-10-CM | POA: Diagnosis not present

## 2023-07-10 NOTE — Progress Notes (Signed)
 This service is provided via telemedicine  No vital signs collected/recorded due to the encounter was a telemedicine visit.   Location of patient (ex: home, work):  Home   Patient consents to a telephone visit:  Yes, 07/10/23   Location of the provider (ex: office, home):  [facility]  Name of any referring provider:  [name], Elijio Guadeloupe, NP   Names of all persons participating in the telemedicine service and their role in the encounter: Zayden Hahne B/CMA, Inge Mangle, NP , and patient  Time spent on call:  11 minutes

## 2023-07-10 NOTE — Progress Notes (Signed)
 I connected with  Dana Beltran on 07/10/23 by a video enabled telemedicine application and verified that I am speaking with the correct person using two identifiers.   I discussed the limitations of evaluation and management by telemedicine. The patient expressed understanding and agreed to proceed.

## 2023-07-10 NOTE — Patient Instructions (Signed)
 Stop taking Ezetimibe .

## 2023-07-10 NOTE — Progress Notes (Signed)
 Virtual Visit via Video Note  I connected with Dana Beltran on 07/12/23 at 10:20 AM EDT by a video enabled telemedicine application and verified that I am speaking with the correct person using two identifiers.  Patient Location: Home Provider Location: Office/Clinic  I discussed the limitations, risks, security, and privacy concerns of performing an evaluation and management service by video and the availability of in person appointments. I also discussed with the patient that there may be a patient responsible charge related to this service. The patient expressed understanding and agreed to proceed.  Subjective: PCP: [name], Elijio Guadeloupe, NP  Chief Complaint  Patient presents with   Rash    rash   HPI Discussed the use of AI scribe software for clinical note transcription with the patient, who gave verbal consent to proceed.  History of Present Illness   Dana Beltran is a 78 year old female had a video visit today due  possible side effects from ezetimibe .  She has been taking ezetimibe  for a week to manage her cholesterol levels and has experienced side effects including rashes, coughing, nasal congestion, and a sore throat, initially thought to be a cold. She also fainted on May 21st, which she later associated with the medication.  She has a history of mixed hyperlipidemia, with her last cholesterol level recorded at 235 mg/dL and LDL at 045 mg/dL three months ago. She is currently on atorvastatin  80 mg daily.  She has a special form of anemia and receives iron  infusions. She frequently feels cold and has been undergoing iron  infusions with medication for the past three weeks. Her last hemoglobin level was 9.1 g/dL three months ago, and she is scheduled to follow up with her hematologist on June 10th.  She has a history of congestive heart failure and takes Farxiga  10 mg daily, furosemide  20 mg daily, and spironolactone  25 mg daily. She experiences occasional shortness of breath, which  she initially attributed to her age. She also takes metoprolol  succinate 25 mg daily and valsartan-hydrochlorothiazide 320-12.5 mg daily for hypertension. Her last recorded blood pressure was 50/130 mmHg.  She also takes vitamin D3 and B12 supplements. She stopped taking zinc after experiencing vomiting, which she attributed to the supplement.  She reports a rash on the inner side of her thigh that itches and has turned red, initially thought to be an allergy. She has been applying rubbing alcohol to it, but it persists. No smell is associated with the rash.    ROS: Per HPI  Current Outpatient Medications:    Ascorbic Acid (VITAMIN C) 1000 MG tablet, Take 1,000 mg by mouth daily. Take 2 tablets daily, Disp: , Rfl:    ASPIRIN  81 PO, Take 81 mg by mouth daily., Disp: , Rfl:    FARXIGA  10 MG TABS tablet, Take 1 tablet (10 mg total) by mouth daily before breakfast., Disp: 90 tablet, Rfl: 3   metoprolol  succinate (TOPROL -XL) 25 MG 24 hr tablet, Take 1 tablet (25 mg total) by mouth daily. Take with or immediately following a meal., Disp: 90 tablet, Rfl: 3   UNABLE TO FIND, Take 1 Bottle by mouth as directed. Med Name: Magic Mouthwash   1 part 2% viscous lidocaine, 1 part maalox, 1 part diphenhydramine , 1 part nystatin suspension  Rinse mouth with capful after meals and when pain is present PRN., Disp: , Rfl:    valsartan-hydrochlorothiazide (DIOVAN-HCT) 320-12.5 MG tablet, TAKE 1 TABLET BY MOUTH  DAILY, Disp: 90 tablet, Rfl: 3   atorvastatin  (LIPITOR)  80 MG tablet, Take 1 tablet (80 mg total) by mouth daily., Disp: 90 tablet, Rfl: 3   furosemide  (LASIX ) 20 MG tablet, Take 1 tablet (20 mg total) by mouth daily., Disp: 90 tablet, Rfl: 3   spironolactone  (ALDACTONE ) 25 MG tablet, Take 1 tablet (25 mg total) by mouth daily., Disp: 90 tablet, Rfl: 3  Observations/Objective: There were no vitals filed for this visit. Physical Exam  Assessment and Plan:  1. Medication side effect (Primary) -  Rashes,  cough, and sore throat suspected as Ezetimibe  side effects. Discontinued Ezetimibe . - Discontinue Ezetimibe .  2. Iron  deficiency anemia due to chronic blood loss Lab Results  Component Value Date   HGB 9.1 (L) 04/01/2023    -  Under hematologist care. Receiving iron  infusions. Last hemoglobin 9.1 g/dL. Follow-up scheduled. - Follow up with hematologist on June 10th.  3. Chronic heart failure with preserved ejection fraction (HCC) -  Managed with Farxiga , Furosemide , and Spironolactone . Occasional dyspnea noted. - Continue Farxiga  10 mg daily. - Continue Furosemide  20 mg daily. - Continue Spironolactone  25 mg daily.  4. Essential hypertension -  Managed with Metoprolol  and Valsartan-Hydrochlorothiazide. Blood pressure controlled. - Continue Metoprolol  25 mg daily. - Continue Valsartan-Hydrochlorothiazide 320/12.5 mg daily.  5. Mixed hyperlipidemia Lab Results  Component Value Date   TSH 0.997 01/13/2023   Cholesterol and LDL elevated. On Atorvastatin . Plan to monitor levels. - Continue Atorvastatin  80 mg daily.      Follow Up Instructions: Return if symptoms worsen or fail to improve.   I discussed the assessment and treatment plan with the patient. The patient was provided an opportunity to ask questions, and all were answered. The patient agreed with the plan and demonstrated an understanding of the instructions.   The patient was advised to call back or seek an in-person evaluation if the symptoms worsen or if the condition fails to improve as anticipated.  The above assessment and management plan was discussed with the patient. The patient verbalized understanding of and has agreed to the management plan.   Khoen Genet Medina-Vargas, NP

## 2023-07-13 ENCOUNTER — Other Ambulatory Visit: Payer: Self-pay

## 2023-07-13 DIAGNOSIS — D500 Iron deficiency anemia secondary to blood loss (chronic): Secondary | ICD-10-CM

## 2023-07-14 ENCOUNTER — Other Ambulatory Visit: Payer: Medicare (Managed Care)

## 2023-07-14 ENCOUNTER — Inpatient Hospital Stay: Payer: Self-pay | Attending: Hematology

## 2023-07-14 ENCOUNTER — Inpatient Hospital Stay: Payer: Medicare (Managed Care) | Attending: Hematology | Admitting: Hematology

## 2023-07-14 DIAGNOSIS — D649 Anemia, unspecified: Secondary | ICD-10-CM | POA: Diagnosis not present

## 2023-07-14 DIAGNOSIS — D472 Monoclonal gammopathy: Secondary | ICD-10-CM

## 2023-07-14 DIAGNOSIS — D500 Iron deficiency anemia secondary to blood loss (chronic): Secondary | ICD-10-CM | POA: Diagnosis not present

## 2023-07-14 DIAGNOSIS — D509 Iron deficiency anemia, unspecified: Secondary | ICD-10-CM | POA: Insufficient documentation

## 2023-07-14 LAB — FERRITIN: Ferritin: 779 ng/mL — ABNORMAL HIGH (ref 11–307)

## 2023-07-14 LAB — IRON AND IRON BINDING CAPACITY (CC-WL,HP ONLY)
Iron: 60 ug/dL (ref 28–170)
Saturation Ratios: 22 % (ref 10.4–31.8)
TIBC: 267 ug/dL (ref 250–450)
UIBC: 207 ug/dL (ref 148–442)

## 2023-07-14 LAB — CBC WITH DIFFERENTIAL (CANCER CENTER ONLY)
Abs Immature Granulocytes: 0 10*3/uL (ref 0.00–0.07)
Basophils Absolute: 0 10*3/uL (ref 0.0–0.1)
Basophils Relative: 1 %
Eosinophils Absolute: 0.1 10*3/uL (ref 0.0–0.5)
Eosinophils Relative: 4 %
HCT: 28.1 % — ABNORMAL LOW (ref 36.0–46.0)
Hemoglobin: 9.8 g/dL — ABNORMAL LOW (ref 12.0–15.0)
Immature Granulocytes: 0 %
Lymphocytes Relative: 37 %
Lymphs Abs: 1.3 10*3/uL (ref 0.7–4.0)
MCH: 27.5 pg (ref 26.0–34.0)
MCHC: 34.9 g/dL (ref 30.0–36.0)
MCV: 78.9 fL — ABNORMAL LOW (ref 80.0–100.0)
Monocytes Absolute: 0.3 10*3/uL (ref 0.1–1.0)
Monocytes Relative: 10 %
Neutro Abs: 1.6 10*3/uL — ABNORMAL LOW (ref 1.7–7.7)
Neutrophils Relative %: 48 %
Platelet Count: 165 10*3/uL (ref 150–400)
RBC: 3.56 MIL/uL — ABNORMAL LOW (ref 3.87–5.11)
RDW: 16.6 % — ABNORMAL HIGH (ref 11.5–15.5)
WBC Count: 3.4 10*3/uL — ABNORMAL LOW (ref 4.0–10.5)
nRBC: 0 % (ref 0.0–0.2)

## 2023-07-14 LAB — CMP (CANCER CENTER ONLY)
ALT: 14 U/L (ref 0–44)
AST: 16 U/L (ref 15–41)
Albumin: 4 g/dL (ref 3.5–5.0)
Alkaline Phosphatase: 87 U/L (ref 38–126)
Anion gap: 7 (ref 5–15)
BUN: 39 mg/dL — ABNORMAL HIGH (ref 8–23)
CO2: 26 mmol/L (ref 22–32)
Calcium: 9.5 mg/dL (ref 8.9–10.3)
Chloride: 103 mmol/L (ref 98–111)
Creatinine: 1.54 mg/dL — ABNORMAL HIGH (ref 0.44–1.00)
GFR, Estimated: 35 mL/min — ABNORMAL LOW (ref >60)
Glucose, Bld: 87 mg/dL (ref 70–99)
Potassium: 3.8 mmol/L (ref 3.5–5.1)
Sodium: 136 mmol/L (ref 135–145)
Total Bilirubin: 0.5 mg/dL (ref 0.0–1.2)
Total Protein: 7.4 g/dL (ref 6.5–8.1)

## 2023-07-14 LAB — SAMPLE TO BLOOD BANK

## 2023-07-14 LAB — LACTATE DEHYDROGENASE: LDH: 131 U/L (ref 98–192)

## 2023-07-14 NOTE — Progress Notes (Signed)
 HEMATOLOGY/ONCOLOGY CLINIC NOTE  Date of Service: 07/19/2023  Patient Care Team: [name], Elijio Guadeloupe, NP as PCP - General (Family Medicine) Cody Das, MD as PCP - Cardiology (Cardiology) Frankie Israel, MD as Consulting Physician (Hematology)  CHIEF COMPLAINTS/PURPOSE OF CONSULTATION:  progressive anemia, abnormal SPEP and normocytic anemia.  HISTORY OF PRESENTING ILLNESS:   Dana Beltran is a wonderful 78 y.o. female who has been referred to us  by Rachael Budd, DO for evaluation and management of progressive anemia, abnormal SPEP and normocytic anemia.  Patient was seen at [facility] by Barbarann Levy, NP on 09/15/2022.   Recent lab workup showed IgG of 1935 with M spike of 0.9, elevated kappa chains of 70.9 with normal lambda light chains 24.1. Ratio 2.94. hgb 8.9 on 11/07/2022.   She had a bone marrow biopsy yesterday and the results are pending at this time.   She reports that she has had anemia for a while, more so as she has gotten older. She has not required blood transfusions in the past.  Patient complains of feeling cold constantly. Patient complains of weakness for at least 2 months. She complains of brain fog.   She complains of palpitations with minimal activity and SOB. Patient reports that her heart thickening is managed by cardiology. She has follow up with her cardiologist on Tuesday of next week.   Patient reports that she was intermittently fasting for some time and did not eat after 8 pm. She consequently lost 60s pounds in less than 6 months. She has discontinued intermittent fasting at this time. Patient has been taking ensure. Patient complains of taste issues due to medication she takes. Patient reports that she has allergies to oranges, mango, and chocolate, among other foods. She notes that she did not drink water today.  She complains of needing to take Zofran  daily prior to eating and drinking to prevent nausea. This is a new symptom. She  reports that she cannot eat full meals. She has no abdominal pain. She has not had a bowel movement for 2 weeks. She just started taking laxatives yesterday. Patient notes that generally, if she consumes something hot, it does cause a bowel movement. She has not tried this recently. She does have Colase at home.  She has had leg swelling for years. Patient reports that years ago, a door fell on her left leg, and has had leg swelling for years. She has bilateral leg swelling L>R. She has not been on any water pills recently but was previously years ago.   She reports that her maternal first cousin did have cancer. She is unaware of any other cancer in the family. Patient has a fhx of sickle cell disease and thalassemia, including in her cousin and aunt. Patient does not have a hx of sickle cell disease or thalassemia.  Patient lives by herself in an independent living facility. Her neighbors generally provide meals for her.   Patient reports that a few months ago a doctor found a thyroid  nodule on her neck. She reports that she has been putting wrinkle cream on her neck regularly and never noticed it.   Her last mammogram was in September and showed normal findings. There were findings of polyps in her colon a few years ago. Patient had a colonoscopy last year which showed no concerns. She did previously have an endoscopy.  No new bone pain in spine, shoulders, or hips. No other new symptoms. She denies any abdominal pain.   INTERVAL  HISTORY: Dana Beltran is a wonderful 78 y.o. female who is here for continued evaluation and management of progressive anemia, abnormal SPEP and normocytic anemia.   Patient was last seen by me on 04/08/2023 and complained of persistent palpitations only after activity.  Patient reports having a syncope episode on May 21st which caused injury to the right knee, which is swollen and sore at this time.   She reports a rash in her left lower extremity and notes that she is  starting to break out in the right foot.   Patient also reports issues with her gum and notes that she was seen by a dentist.   She has tolerated IV iron  infusion well.   Patient will see her nephrologist on Friday, 07/17/2023 for management of CKD.  She reports leg swelling, which has improved since wearing compression socks.   Patient reports that she has lost some weight, attributed to loss of muscle mass, and notes that she has been exercising more recently to improve her muscle mass.   She reports a cough which she does not attribute to her allergies.   Patient complains of feeling cold, which is a new symptom. SHe typically drinks room-temperature water.   Patient does regularly consume protein and vegetables in her diet. She reports that she generally limits carbohydrates in her diet.   Patient denies any SOB, chest pain, abdominal pain, concern for bleeding, black stools, blood in stools, nose bleeds, or gum bleeds.  Her leg swelling is fairly mild at this time. She reports rashes only on the inside of her leg from venous congestion.   She reports dizziness and fall, which she has addressed with her PCP.   MEDICAL HISTORY:  Past Medical History:  Diagnosis Date   Allergy 1959   Tested at 12 years   Anemia 2025   Tested positive   Arthritis 2024   Tested   Cataract disorder type 14    CHF (congestive heart failure) (HCC) 2016   Heart Attack   Coronary artery disease    Coronary artery disease    Diabetes mellitus without complication (HCC)    Hypertension    Hypovolemia    Leg edema, left    after door fell on her at work    MI, old    Mixed hyperlipidemia    Snoring     SURGICAL HISTORY: Past Surgical History:  Procedure Laterality Date   CARDIAC SURGERY     Stent placement x4   EYE SURGERY  2023   [place]   pericardial effusion      SOCIAL HISTORY: Social History   Socioeconomic History   Marital status: Divorced    Spouse name: Not on file    Number of children: 0   Years of education: Not on file   Highest education level: 12th grade  Occupational History   Not on file  Tobacco Use   Smoking status: Never   Smokeless tobacco: Never   Tobacco comments:    Never smoked  Vaping Use   Vaping status: Never Used  Substance and Sexual Activity   Alcohol use: Never   Drug use: Never   Sexual activity: Not Currently    Birth control/protection: Abstinence  Other Topics Concern   Not on file  Social History Narrative   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    Social Drivers of Health   Financial Resource Strain: Medium Risk (05/19/2023)   Overall Financial Resource Strain (CARDIA)    Difficulty of Paying Living  Expenses: Somewhat hard  Food Insecurity: Food Insecurity Present (05/19/2023)   Hunger Vital Sign    Worried About Running Out of Food in the Last Year: Often true    Ran Out of Food in the Last Year: Sometimes true  Transportation Needs: No Transportation Needs (05/19/2023)   PRAPARE - Administrator, Civil Service (Medical): No    Lack of Transportation (Non-Medical): No  Physical Activity: Sufficiently Active (05/19/2023)   Exercise Vital Sign    Days of Exercise per Week: 7 days    Minutes of Exercise per Session: 30 min  Stress: Stress Concern Present (05/19/2023)   [employer] of Occupational Health - Occupational Stress Questionnaire    Feeling of Stress : To some extent  Social Connections: Unknown (05/19/2023)   Social Connection and Isolation Panel    Frequency of Communication with Friends and Family: More than three times a week    Frequency of Social Gatherings with Friends and Family: More than three times a week    Attends Religious Services: Patient declined    Database administrator or Organizations: Yes    Attends Engineer, structural: More than 4 times per year    Marital Status: Divorced  Intimate Partner Violence: Not At Risk (10/29/2022)   Humiliation, Afraid, Rape, and  Kick questionnaire    Fear of Current or Ex-Partner: No    Emotionally Abused: No    Physically Abused: No    Sexually Abused: No    FAMILY HISTORY: Family History  Problem Relation Age of Onset   High blood pressure Father    Arthritis Father     ALLERGIES:  is allergic to coconut (cocos nucifera), orange oil, pineapple, prunus persica, strawberry extract, tomato, chocolate, citrus, dust mite extract, ezetimibe , other, and peach flavoring agent (non-screening).   MEDICATIONS:  Current Outpatient Medications  Medication Sig Dispense Refill   Ascorbic Acid (VITAMIN C) 1000 MG tablet Take 1,000 mg by mouth daily. Take 2 tablets daily     ASPIRIN  81 PO Take 81 mg by mouth daily.     atorvastatin  (LIPITOR) 80 MG tablet Take 1 tablet (80 mg total) by mouth daily. 90 tablet 3   FARXIGA  10 MG TABS tablet Take 1 tablet (10 mg total) by mouth daily before breakfast. 90 tablet 3   furosemide  (LASIX ) 20 MG tablet Take 1 tablet (20 mg total) by mouth daily. 90 tablet 3   metoprolol  succinate (TOPROL -XL) 25 MG 24 hr tablet Take 1 tablet (25 mg total) by mouth daily. Take with or immediately following a meal. 90 tablet 3   spironolactone  (ALDACTONE ) 25 MG tablet Take 1 tablet (25 mg total) by mouth daily. 90 tablet 3   valsartan-hydrochlorothiazide (DIOVAN-HCT) 320-12.5 MG tablet TAKE 1 TABLET BY MOUTH  DAILY 90 tablet 3   UNABLE TO FIND Take 1 Bottle by mouth as directed. Med Name: Magic Mouthwash   1 part 2% viscous lidocaine, 1 part maalox, 1 part diphenhydramine , 1 part nystatin suspension  Rinse mouth with capful after meals and when pain is present PRN.     No current facility-administered medications for this visit.    REVIEW OF SYSTEMS:    10 Point review of Systems was done is negative except as noted above.   PHYSICAL EXAMINATION: ECOG PERFORMANCE STATUS: 1 - Symptomatic but completely ambulatory VSS GENERAL:alert, in no acute distress and comfortable SKIN: no acute rashes,  no significant lesions EYES: conjunctiva are pink and non-injected, sclera anicteric OROPHARYNX: MMM,  no exudates, no oropharyngeal erythema or ulceration NECK: supple, no JVD LYMPH:  no palpable lymphadenopathy in the cervical, axillary or inguinal regions LUNGS: clear to auscultation b/l with normal respiratory effort HEART: regular rate & rhythm ABDOMEN:  normoactive bowel sounds , non tender, not distended. Extremity: no pedal edema PSYCH: alert & oriented x 3 with fluent speech NEURO: no focal motor/sensory deficits   LABORATORY DATA:  I have reviewed the data as listed  .    Latest Ref Rng & Units 07/14/2023    7:46 AM 04/01/2023    8:57 AM 01/13/2023    3:21 PM  CBC  WBC 4.0 - 10.5 K/uL 3.4  4.1  4.6   Hemoglobin 12.0 - 15.0 g/dL 9.8  9.1  8.4   Hematocrit 36.0 - 46.0 % 28.1  26.7  24.4   Platelets 150 - 400 K/uL 165  208  308     .    Latest Ref Rng & Units 07/14/2023    7:46 AM 04/01/2023    8:57 AM 12/30/2022   10:54 AM  CMP  Glucose 70 - 99 mg/dL 87  90  96   BUN 8 - 23 mg/dL 39  34  15   Creatinine 0.44 - 1.00 mg/dL 1.61  0.96  0.45   Sodium 135 - 145 mmol/L 136  136  136   Potassium 3.5 - 5.1 mmol/L 3.8  4.0  4.2   Chloride 98 - 111 mmol/L 103  101  100   CO2 22 - 32 mmol/L 26  29  25    Calcium  8.9 - 10.3 mg/dL 9.5  9.0  9.5   Total Protein 6.5 - 8.1 g/dL 7.4  6.8    Total Bilirubin 0.0 - 1.2 mg/dL 0.5  0.4    Alkaline Phos 38 - 126 U/L 87  69    AST 15 - 41 U/L 16  16    ALT 0 - 44 U/L 14  14     . Lab Results  Component Value Date   IRON  60 07/14/2023   TIBC 267 07/14/2023   IRONPCTSAT 22 07/14/2023   (Iron  and TIBC)  Lab Results  Component Value Date   FERRITIN 779 (H) 07/14/2023    Fish analysis MDS standard 01/21/2023:      12/17/2022 FISH Analysis Plasma cell Myeloma Prognostic Panel:       Lab work from 09/05/2022:         RADIOGRAPHIC STUDIES: I have personally reviewed the radiological images as listed and agreed  with the findings in the report. No results found.   ASSESSMENT & PLAN:   78 y.o. female with:  Normocytic Anemia abnormal SPEP -- m spike of 0.7g/dl, BMBx 5% plasma cells with slight kappa predominance , Myeloma FISH panel - no detectable mutations. Overall consistent with MGUS. 3. Renal insufficiency  PLAN:  -Discussed lab results on 07/14/23 in detail with patient. CBC showed WBC of 3.4K, hemoglobin of 9.8, and platelets of 165K. -hgb improved from 7-8 to 9.8 currently -platelets normal -WBCs normal -discussed that her anemia is multifactorial, and the very small M spike is not the main cause of her anemia -discussed that her anemia is primarily driven by CKD which is causing some level of inflammation -discussed that there is a need to keep her iron  levels higher than normal with ferritin of at least 250 and sometimes 500 based on hemoglobin response -iron  saturation 22% -ferritin pending at this time -will review ferritin lab -discussed that  if her ferritin is found to be less than 250, she would have the option of IV iron . Ferritin >500 and iron  saturation of 22%. No indication for additional IV iron  at this time. -discussed that her hgb is not at a level to cause dizziness or syncope -discussed that after her iron  is optimized, is her hgb<9, there would be a role to consider EPO shots once a month to keep hgb>9 -there is no indication for EPO shot at this time -recommend consuming complex carbohydrates with low glycemic index in the diet. Educated patient that carbohydrates are 60% of the body's fuel source and would also help to generate heat in the body.  -discussed that anemia can cause her to feel mild coldness -discussed that fluid retention can also cause her to feel cold -discussed that certain foods can help generate heat in the body, such as nuts  -recommend staying well-hydrated with 64 ounces of water intake daily -recommend taking vitamin B complex to ensure there  are no B vitamin deficiencies -continue to stay physically active -answered all of patient's questions in detail -she shall return to clinic in 3-4 months -patient noted to be on multiple diuretic medications, which can cause dizziness. Discussed that medications that can cause dizziness include Spironolactone , Hydrochlorothiazide, Lasix , and Farxiga .  -continue to manage diuretics with PCP  FOLLOW-UP: RTC with Dr Salomon Cree with labs in 3 months  The total time spent in the appointment was 25 minutes* .  All of the patient's questions were answered with apparent satisfaction. The patient knows to call the clinic with any problems, questions or concerns.   Jacquelyn Matt MD MS AAHIVMS [facility] Hematology/Oncology Physician [facility]  .*Total Encounter Time as defined by the Centers for Medicare and Medicaid Services includes, in addition to the face-to-face time of a patient visit (documented in the note above) non-face-to-face time: obtaining and reviewing outside history, ordering and reviewing medications, tests or procedures, care coordination (communications with other health care professionals or caregivers) and documentation in the medical record.   I,Mitra Faeizi,acting as a Neurosurgeon for Jacquelyn Matt, MD.,have documented all relevant documentation on the behalf of Jacquelyn Matt, MD,as directed by  Jacquelyn Matt, MD while in the presence of Jacquelyn Matt, MD.  .I have reviewed the above documentation for accuracy and completeness, and I agree with the above. .Pepper Kerrick Kishore Dannia Snook MD

## 2023-07-17 DIAGNOSIS — N1832 Chronic kidney disease, stage 3b: Secondary | ICD-10-CM | POA: Diagnosis not present

## 2023-07-17 DIAGNOSIS — I129 Hypertensive chronic kidney disease with stage 1 through stage 4 chronic kidney disease, or unspecified chronic kidney disease: Secondary | ICD-10-CM | POA: Diagnosis not present

## 2023-07-19 ENCOUNTER — Encounter: Payer: Self-pay | Admitting: Hematology

## 2023-08-05 ENCOUNTER — Encounter: Payer: Self-pay | Admitting: Family Medicine

## 2023-08-10 ENCOUNTER — Encounter: Payer: Self-pay | Admitting: Family Medicine

## 2023-08-10 NOTE — Progress Notes (Signed)
 Patient was not seen

## 2023-08-26 ENCOUNTER — Encounter: Payer: Self-pay | Admitting: Family

## 2023-08-26 ENCOUNTER — Ambulatory Visit: Payer: Medicare (Managed Care) | Admitting: Family

## 2023-08-26 VITALS — BP 138/84 | HR 62 | Temp 97.6°F | Resp 20 | Ht 62.0 in | Wt 155.2 lb

## 2023-08-26 DIAGNOSIS — I5032 Chronic diastolic (congestive) heart failure: Secondary | ICD-10-CM

## 2023-08-26 DIAGNOSIS — E041 Nontoxic single thyroid nodule: Secondary | ICD-10-CM

## 2023-08-26 DIAGNOSIS — E782 Mixed hyperlipidemia: Secondary | ICD-10-CM

## 2023-08-26 DIAGNOSIS — D500 Iron deficiency anemia secondary to blood loss (chronic): Secondary | ICD-10-CM

## 2023-08-26 DIAGNOSIS — I10 Essential (primary) hypertension: Secondary | ICD-10-CM | POA: Diagnosis not present

## 2023-08-26 LAB — LIPID PANEL
Cholesterol: 195 mg/dL (ref <200)
HDL: 77 mg/dL (ref >=50)
LDL Cholesterol (Calc): 105 mg/dL — ABNORMAL HIGH
Non-HDL Cholesterol (Calc): 118 mg/dL (ref <130)
Total CHOL/HDL Ratio: 2.5 (calc) (ref <5.0)
Triglycerides: 50 mg/dL (ref <150)

## 2023-08-26 LAB — TSH: TSH: 2.05 m[IU]/L (ref 0.40–4.50)

## 2023-08-27 ENCOUNTER — Telehealth: Payer: Self-pay

## 2023-08-27 ENCOUNTER — Ambulatory Visit: Payer: Self-pay | Admitting: Family

## 2023-08-27 NOTE — Telephone Encounter (Signed)
 Please address labs and send to [facility] Clinical Pool for one of the medication assistants to follow-up with patient

## 2023-08-27 NOTE — Telephone Encounter (Signed)
 Copied from CRM [id number]. Topic: Clinical - Lab/Test Results >> Aug 27, 2023 10:03 AM Mercer PEDLAR wrote: Reason for CRM: Patient stated that she got a notification that her lab results are ready but is not able to get into her MyChart account. She would like a callback for results.

## 2023-08-27 NOTE — Telephone Encounter (Signed)
 See lab results.

## 2023-09-04 NOTE — Progress Notes (Signed)
 Provider: Roxan Plough FNP-C   Norie Latendresse, [name], NP  Patient Care Team: Monica Zahler, [name], NP as PCP - General (Family Medicine) El[place]ira [name], MD as PCP - Cardiology (Cardiology) [name], MD as Consulting Physician (Hematology)  Extended Emergency Contact Information Primary Emergency Contact: Mills,Diana Home Phone: [phone] Mobile Phone: [phone] Relation: Friend Interpreter needed? No Secondary Emergency Contact: [facility] [place] Phone: [phone] Mobile Phone: [phone] Relation: Relative  Code Status:  Full Code  Goals of care: Advanced Directive information    08/26/2023    9:40 AM  Advanced Directives  Does Patient Have a Medical Advance Directive? No  Would patient like information on creating a medical advance directive? No - Patient declined     Chief Complaint  Patient presents with   Medical Management of Chronic Issues    4 Month follow up    Discussed the use of AI scribe software for clinical note transcription with the patient, who gave verbal consent to proceed.  History of Present Illness   Dana Beltran is a 78 year old female with diabetes, high cholesterol, and chronic heart failure who presents for a four-month follow-up and to obtain a doctor's note to return to school.  She has been out of school since October due to multiple health issues and requires a doctor's note to confirm she is in good health to return. She had nodules in her throat that were evaluated and found to be non-cancerous, with no thyroid  abnormalities. Additionally, she had a kidney issue that necessitated a specialist consultation.  She has a history of diabetes and high cholesterol. 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 She fainted on May 21st due to medication side effects, which resolved after discontinuing the medication, though she does not  recall the name of the medication.  She experienced significant weight loss from 199 to 130 pounds due to an inability to eat or drink, but has since regained weight to 155 pounds. She had side effects from cholesterol medication, including rash, cough, and sore throat, which resolved after discontinuing the medication. She is currently on metoprolol , valsartan, and hydrochlorothiazide for blood pressure, and Farxiga , furosemide , and spironolactone  for heart failure.  She has chronic heart failure with a history of decreased left ventricular function. She takes B12 and D3 supplements for chronic anemia and has had a blood transfusion. Her hemoglobin was 9.8 in June, and she has a follow-up with her hematologist in August.  She travels using public transportation and recently took a trip from [place] to [place], [place] . 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 She does not have children or grandchildren and considers her students as her children.  She has a history of diabetes but does not regularly check her blood sugars, relying on symptoms to manage her condition. She avoids carbohydrates and sweets to maintain her blood sugar levels. She is not on any diabetic medications.  No recent seizures, good blood pressure control, and no current issues with bowel movements or urination. She experienced a fainting episode in May due to medication side effects, which resolved after discontinuation. No current palpitations and her knee swelling has resolved.   Past Medical History:  Diagnosis Date   Allergy 1959   Tested at 12 years   Anemia 2025   Tested positive   Arthritis 2024   Tested   Cataract disorder type  14    CHF (congestive heart failure) (HCC) 2016   Heart Attack   Coronary artery disease    Coronary artery disease    Diabetes mellitus without complication (HCC)    Hypertension    Hypovolemia    Leg edema, left    after  door fell on her at work    MI, old    Mixed hyperlipidemia    Snoring    Past Surgical History:  Procedure Laterality Date   CARDIAC SURGERY     Stent placement x4   EYE SURGERY  2023   [place]   pericardial effusion      Allergies  Allergen Reactions   Coconut (Cocos Nucifera) Rash   Orange Oil Rash   Pineapple Rash   Prunus Persica Rash   Strawberry Extract Rash and Hives   Tomato Rash   Chocolate Rash and Hives   Citrus Rash and Hives   Dust Mite Extract Hives   Ezetimibe  Rash   Other Hives   Peach Flavoring Agent (Non-Screening) Rash    Allergies as of 08/26/2023       Reactions   Coconut (cocos Nucifera) Rash   Orange Oil Rash   Pineapple Rash   Prunus Persica Rash   Strawberry Extract Rash, Hives   Tomato Rash   Chocolate Rash, Hives   Citrus Rash, Hives   Dust Mite Extract Hives   Ezetimibe  Rash   Other Hives   Peach Flavoring Agent (non-screening) Rash        Medication List        Accurate as of August 26, 2023 11:59 PM. If you have any questions, ask your nurse or doctor.          STOP taking these medications    ezetimibe  10 MG tablet Commonly known as: ZETIA  Stopped by: Jenean Escandon C Izacc Demeyer       TAKE these medications    ASPIRIN  81 PO Take 81 mg by mouth daily.   atorvastatin  80 MG tablet Commonly known as: LIPITOR Take 1 tablet (80 mg total) by mouth daily.   Farxiga  10 MG Tabs tablet Generic drug: dapagliflozin propanediol  Take 1 tablet (10 mg total) by mouth daily before breakfast.   furosemide  20 MG tablet Commonly known as: LASIX  Take 1 tablet (20 mg total) by mouth daily.   metoprolol  succinate 25 MG 24 hr tablet Commonly known as: TOPROL -XL Take 1 tablet (25 mg total) by mouth daily. Take with or immediately following a meal.   spironolactone  25 MG tablet Commonly known as: ALDACTONE  Take 1 tablet (25 mg total) by mouth daily.   UNABLE TO FIND Take 1 Bottle by mouth as directed. Med Name: Magic Mouthwash   1  part 2% viscous lidocaine, 1 part maalox, 1 part diphenhydramine , 1 part nystatin suspension  Rinse mouth with capful after meals and when pain is present PRN.   valsartan-hydrochlorothiazide 320-12.5 MG tablet Commonly known as: DIOVAN-HCT TAKE 1 TABLET BY MOUTH  DAILY   VITAMIN B 12 PO Take by mouth daily.   vitamin C 1000 MG tablet Take 1,000 mg by mouth daily. Take 2 tablets daily   VITAMIN D-3 PO Take by mouth daily.        Review of Systems  Constitutional:  Negative for appetite change, chills, fatigue, fever and unexpected weight change.  HENT:  Negative for congestion, dental problem, ear discharge, ear pain, facial swelling, hearing loss, nosebleeds, postnasal drip, rhinorrhea, sinus pressure, sinus pain, sneezing, sore throat, tinnitus and trouble  swallowing.   Eyes:  Negative for pain, discharge, redness, itching and visual disturbance.  Respiratory:  Negative for cough, chest tightness, shortness of breath and wheezing.   Cardiovascular:  Negative for chest pain, palpitations and leg swelling.  Gastrointestinal:  Negative for abdominal distention, abdominal pain, blood in stool, constipation, diarrhea, nausea and vomiting.  Endocrine: Negative for cold intolerance, heat intolerance, polydipsia, polyphagia and polyuria.  Genitourinary:  Negative for difficulty urinating, dysuria, flank pain, frequency and urgency.  Musculoskeletal:  Negative for arthralgias, back pain, gait problem, joint swelling, myalgias, neck pain and neck stiffness.  Skin:  Negative for color change, pallor, rash and wound.  Neurological:  Negative for dizziness, syncope, speech difficulty, weakness, light-headedness, numbness and headaches.  Hematological:  Does not bruise/bleed easily.  Psychiatric/Behavioral:  Negative for agitation, behavioral problems, confusion, hallucinations, self-injury, sleep disturbance and suicidal ideas. The patient is not nervous/anxious.     Immunization History   Administered Date(s) Administered   Fluad Quad(high Dose 65+) 10/20/2018   Influenza, High Dose Seasonal PF 12/12/2020, 10/30/2021, 11/07/2022   Moderna Covid-19 Fall Seasonal Vaccine 45yrs & older 11/07/2022   Pneumococcal Conjugate-13 10/27/2016   Tdap 10/27/2016   Pertinent  Health Maintenance Due  Topic Date Due   DEXA SCAN  Never done   INFLUENZA VACCINE  09/04/2023      10/30/2018    8:09 AM 05/20/2023    9:51 AM 06/09/2023    8:30 AM 06/23/2023    8:41 AM 08/26/2023    9:37 AM  Fall Risk  Falls in the past year?  0 0 0 0  Was there an injury with Fall?  0 0  0  Fall Risk Category Calculator  0   0  (RETIRED) Patient Fall Risk Level Low fall risk       Patient at Risk for Falls Due to  No Fall Risks  No Fall Risks No Fall Risks  Fall risk Follow up  Falls evaluation completed  Falls evaluation completed Falls evaluation completed     Data saved with a previous flowsheet row definition   Functional Status Survey:    Vitals:   08/26/23 0945  BP: 138/84  Pulse: 62  Resp: 20  Temp: 97.6 F (36.4 C)  SpO2: 99%  Weight: 155 lb 3.2 oz (70.4 kg)  Height: 5' 2 (1.575 m)   Body mass index is 28.39 kg/m. Physical Exam  GENERAL: Alert, cooperative, well developed, no acute distress. HEENT: Normocephalic, normal oropharynx, moist mucous membranes, ears normal, nose normal. NECK: Supple, non-tender. CHEST: Clear to auscultation bilaterally, no wheezes, rhonchi, or crackles. CARDIOVASCULAR: Normal heart rate and rhythm, S1 and S2 normal without murmurs. ABDOMEN: Soft, non-tender, non-distended, without organomegaly, normal bowel sounds. EXTREMITIES: Legs swollen, improved, no cyanosis. NEUROLOGICAL: Cranial nerves grossly intact, moves all extremities without gross motor or sensory deficit.  SKIN: No rash,no lesion or erythema   PSYCHIATRY/BEHAVIORAL: Mood stable    Labs reviewed: Recent Labs    10/29/22 1600 10/30/22 0741 11/12/22 2058 12/10/22 1025  12/30/22 1054 04/01/23 0857 07/14/23 0746  NA 130* 132*   < > 137 136 136 136  K 4.2 3.3*   < > 3.4* 4.2 4.0 3.8  CL 94* 99   < > 94* 100 101 103  CO2 19* 20*   < > 27 25 29 26   GLUCOSE 86 73   < > 107* 96 90 87  BUN 37* 29*   < > 31* 15 34* 39*  CREATININE 2.19* 1.65*   < >  1.54* 0.95 1.53* 1.54*  CALCIUM  9.3 9.1   < > 9.6 9.5 9.0 9.5  MG 1.6* 1.8  --  1.8  --   --   --   PHOS  --  2.9  --   --   --   --   --    < > = values in this interval not displayed.   Recent Labs    11/28/22 1155 04/01/23 0857 07/14/23 0746  AST 16 16 16   ALT 13 14 14   ALKPHOS 61 69 87  BILITOT 1.7* 0.4 0.5  PROT 6.8 6.8 7.4  ALBUMIN  3.4* 3.6 4.0   Recent Labs    01/13/23 1521 04/01/23 0857 07/14/23 0746  WBC 4.6 4.1 3.4*  NEUTROABS 2.5 2.2 1.6*  HGB 8.4* 9.1* 9.8*  HCT 24.4* 26.7* 28.1*  MCV 86.5 83.4 78.9*  PLT 308 208 165   Lab Results  Component Value Date   TSH 2.05 08/26/2023   Lab Results  Component Value Date   HGBA1C 5.5 10/30/2022   Lab Results  Component Value Date   CHOL 195 08/26/2023   HDL 77 08/26/2023   LDLCALC 105 (H) 08/26/2023   TRIG 50 08/26/2023   CHOLHDL 2.5 08/26/2023    Significant Diagnostic Results in last 30 days:  No results found.  Assessment/Plan  Return to School Clearance She requires a doctor's note to return to school, having been absent since October due to multiple health issues, including non-cancerous throat nodules, kidney issues, diabetes, hyperlipidemia, and chronic heart failure. Her conditions are currently well-managed, and she feels capable of returning to school, evidenced by recent independent travel. - Provide a doctor's note for school clearance.  Chronic Heart Failure Chronic heart failure with previously decreased left ventricular size and reduced ejection fraction. Recent evaluations show improvement with adequate ventricular function. She is on Farxiga , furosemide , and spironolactone . - No signs of fluid  overload  Diabetes Mellitus Lab Results  Component Value Date   HGBA1C 5.5 10/30/2022   Diabetes mellitus managed through diet and lifestyle modifications, avoiding carbohydrates and sweets. She does not take diabetic medications and rarely checks blood glucose, relying on physical symptoms.   Chronic Anemia Chronic anemia managed with B12 and D3 supplements. Hemoglobin was 9.8 in June. She continues follow-up with her hematologist, with an appointment in August.  Hyperlipidemia Hyperlipidemia with previous medication side effects leading to discontinuation. She is not on current lipid-lowering therapy. - Order lipid panel to assess current lipid levels.  Thyroid  Nodules Non-cancerous thyroid  nodules with no current thyroid  dysfunction. Last TSH test was in December 2024. - Order TSH test to assess thyroid  function.  General Health Maintenance Due for vaccinations and screenings, including mammogram, flu shot, COVID vaccine, shingles vaccine series, and bone density test. - Advise completion of shingles vaccine series at the pharmacy. - Verify bone density test records and schedule if due. - Schedule Medicare wellness visit. - Advise to receive flu and COVID vaccines in September.  Follow-up Routine follow-up for health maintenance and chronic condition monitoring. - Schedule six-month follow-up appointment after lab tests. - Follow up with hematologist in August.   Family/ staff Communication: Reviewed plan of care with patient verbalized understanding   Labs/tests ordered:  - Lipid panel  - TSH   Next Appointment : Return in about 6 months (around 02/26/2024) for medical mangement of chronic issues., Annual wellness visit soon .   Spent 30 minutes of Face to face and non-face to face with patient  >50% time  spent counseling; reviewing medical record; tests; labs; documentation and developing future plan of care.   Roxan JAYSON Plough, NP

## 2023-09-22 ENCOUNTER — Ambulatory Visit: Payer: Medicare (Managed Care) | Admitting: Family

## 2023-10-06 ENCOUNTER — Telehealth: Payer: Self-pay | Admitting: Cardiology

## 2023-10-06 NOTE — Telephone Encounter (Signed)
   Pre-operative Risk Assessment    Patient Name: Dana Beltran  DOB: Sep 11, 1945 MRN: [id number]   Date of last office visit: 04/07/23 Date of next office visit: Not yet scheduled   Request for Surgical Clearance    Procedure:  Patient will will be having probe under gums for measurement  Date of Surgery:  Clearance 10/07/23                                Surgeon:  Dr. Richardson Daring Surgeon's Group or Practice Name:  [facility] Phone number:  [phone]  Fax number:  [phone]   Type of Clearance Requested:   - Medical  - Pharmacy:  Hold        Type of Anesthesia:  None    Additional requests/questions:  Caller Johnny) stated they will need to know if patient needs to be pre-medication prior to procedure.  Caller noted patient has appointment tomorrow (9/3).  Signed, Jasmin B Wilson   10/06/2023, 1:05 PM

## 2023-10-06 NOTE — Telephone Encounter (Signed)
    Primary Cardiologist: Newman JINNY Lawrence, MD  Chart reviewed as part of pre-operative protocol coverage. Simple dental extractions are considered low risk procedures per guidelines and generally do not require any specific cardiac clearance. It is also generally accepted that for simple extractions and dental cleanings, gingival probing, there is no need to interrupt blood thinner therapy.   SBE prophylaxis is not required for the patient.  I will route this recommendation to the requesting party via Epic fax function and remove from pre-op pool.  Please call with questions.  Josefa CHRISTELLA Beauvais, NP 10/06/2023, 1:49 PM

## 2023-10-07 NOTE — Telephone Encounter (Signed)
 2nd request received.  Will route the preop clearance to surgeons office

## 2023-10-13 ENCOUNTER — Other Ambulatory Visit: Payer: Self-pay

## 2023-10-13 DIAGNOSIS — D500 Iron deficiency anemia secondary to blood loss (chronic): Secondary | ICD-10-CM

## 2023-10-14 ENCOUNTER — Inpatient Hospital Stay (HOSPITAL_BASED_OUTPATIENT_CLINIC_OR_DEPARTMENT_OTHER): Admitting: Hematology

## 2023-10-14 ENCOUNTER — Inpatient Hospital Stay: Attending: Hematology

## 2023-10-14 DIAGNOSIS — D472 Monoclonal gammopathy: Secondary | ICD-10-CM | POA: Diagnosis not present

## 2023-10-14 DIAGNOSIS — E041 Nontoxic single thyroid nodule: Secondary | ICD-10-CM | POA: Diagnosis not present

## 2023-10-14 DIAGNOSIS — D709 Neutropenia, unspecified: Secondary | ICD-10-CM | POA: Diagnosis not present

## 2023-10-14 DIAGNOSIS — N289 Disorder of kidney and ureter, unspecified: Secondary | ICD-10-CM | POA: Insufficient documentation

## 2023-10-14 DIAGNOSIS — D500 Iron deficiency anemia secondary to blood loss (chronic): Secondary | ICD-10-CM

## 2023-10-14 DIAGNOSIS — Z860100 Personal history of colon polyps, unspecified: Secondary | ICD-10-CM | POA: Insufficient documentation

## 2023-10-14 DIAGNOSIS — D649 Anemia, unspecified: Secondary | ICD-10-CM | POA: Diagnosis not present

## 2023-10-14 DIAGNOSIS — D509 Iron deficiency anemia, unspecified: Secondary | ICD-10-CM | POA: Insufficient documentation

## 2023-10-14 LAB — IRON AND IRON BINDING CAPACITY (CC-WL,HP ONLY)
Iron: 51 ug/dL (ref 28–170)
Saturation Ratios: 21 % (ref 10.4–31.8)
TIBC: 244 ug/dL — ABNORMAL LOW (ref 250–450)
UIBC: 193 ug/dL (ref 148–442)

## 2023-10-14 LAB — CBC WITH DIFFERENTIAL (CANCER CENTER ONLY)
Abs Immature Granulocytes: 0 K/uL (ref 0.00–0.07)
Basophils Absolute: 0 K/uL (ref 0.0–0.1)
Basophils Relative: 1 %
Eosinophils Absolute: 0.1 K/uL (ref 0.0–0.5)
Eosinophils Relative: 3 %
HCT: 27.4 % — ABNORMAL LOW (ref 36.0–46.0)
Hemoglobin: 9.3 g/dL — ABNORMAL LOW (ref 12.0–15.0)
Immature Granulocytes: 0 %
Lymphocytes Relative: 40 %
Lymphs Abs: 1.1 K/uL (ref 0.7–4.0)
MCH: 27.7 pg (ref 26.0–34.0)
MCHC: 33.9 g/dL (ref 30.0–36.0)
MCV: 81.5 fL (ref 80.0–100.0)
Monocytes Absolute: 0.4 K/uL (ref 0.1–1.0)
Monocytes Relative: 13 %
Neutro Abs: 1.2 K/uL — ABNORMAL LOW (ref 1.7–7.7)
Neutrophils Relative %: 43 %
Platelet Count: 150 K/uL (ref 150–400)
RBC: 3.36 MIL/uL — ABNORMAL LOW (ref 3.87–5.11)
RDW: 16.8 % — ABNORMAL HIGH (ref 11.5–15.5)
WBC Count: 2.7 K/uL — ABNORMAL LOW (ref 4.0–10.5)
nRBC: 0 % (ref 0.0–0.2)

## 2023-10-14 LAB — CMP (CANCER CENTER ONLY)
ALT: 23 U/L (ref 0–44)
AST: 23 U/L (ref 15–41)
Albumin: 3.9 g/dL (ref 3.5–5.0)
Alkaline Phosphatase: 87 U/L (ref 38–126)
Anion gap: 4 — ABNORMAL LOW (ref 5–15)
BUN: 37 mg/dL — ABNORMAL HIGH (ref 8–23)
CO2: 29 mmol/L (ref 22–32)
Calcium: 9.2 mg/dL (ref 8.9–10.3)
Chloride: 105 mmol/L (ref 98–111)
Creatinine: 1.33 mg/dL — ABNORMAL HIGH (ref 0.44–1.00)
GFR, Estimated: 41 mL/min — ABNORMAL LOW (ref >60)
Glucose, Bld: 81 mg/dL (ref 70–99)
Potassium: 4.1 mmol/L (ref 3.5–5.1)
Sodium: 138 mmol/L (ref 135–145)
Total Bilirubin: 0.4 mg/dL (ref 0.0–1.2)
Total Protein: 7.1 g/dL (ref 6.5–8.1)

## 2023-10-14 LAB — FERRITIN: Ferritin: 457 ng/mL — ABNORMAL HIGH (ref 11–307)

## 2023-10-14 LAB — SAMPLE TO BLOOD BANK

## 2023-10-14 LAB — LACTATE DEHYDROGENASE: LDH: 133 U/L (ref 98–192)

## 2023-10-22 NOTE — Progress Notes (Signed)
 HEMATOLOGY/ONCOLOGY CLINIC NOTE  Date of Service: .10/14/2023  Patient Care Team: [name], [name], NP as PCP - General (Family Medicine) [name], [name], MD as PCP - Cardiology (Cardiology) Dana Emaline Brink, MD as Consulting Physician (Hematology)  CHIEF COMPLAINTS/PURPOSE OF CONSULTATION:  Follow-up for MGUS and normocytic anemia  HISTORY OF PRESENTING ILLNESS:   Dana Beltran is a wonderful 78 y.o. female who has been referred to us  by Dana Cones, DO for evaluation and management of progressive anemia, abnormal SPEP and normocytic anemia.  Patient was seen at [facility] by Dana Allean SAUNDERS, NP on 09/15/2022.   Recent lab workup showed IgG of 1935 with M spike of 0.9, elevated kappa chains of 70.9 with normal lambda light chains 24.1. Ratio 2.94. hgb 8.9 on 11/07/2022.   She had a bone marrow biopsy yesterday and the results are pending at this time.   She reports that she has had anemia for a while, more so as she has gotten older. She has not required blood transfusions in the past.  Patient complains of feeling cold constantly. Patient complains of weakness for at least 2 months. She complains of brain fog.   She complains of palpitations with minimal activity and SOB. Patient reports that her heart thickening is managed by cardiology. She has follow up with her cardiologist on Tuesday of next week.   Patient reports that she was intermittently fasting for some time and did not eat after 8 pm. She consequently lost 60s pounds in less than 6 months. She has discontinued intermittent fasting at this time. Patient has been taking ensure. Patient complains of taste issues due to medication she takes. Patient reports that she has allergies to oranges, mango, and chocolate, among other foods. She notes that she did not drink water today.  She complains of needing to take Zofran  daily prior to eating and drinking to prevent nausea. This is a new symptom. She reports that  she cannot eat full meals. She has no abdominal pain. She has not had a bowel movement for 2 weeks. She just started taking laxatives yesterday. Patient notes that generally, if she consumes something hot, it does cause a bowel movement. She has not tried this recently. She does have Colase at home.  She has had leg swelling for years. Patient reports that years ago, a door fell on her left leg, and has had leg swelling for years. She has bilateral leg swelling L>R. She has not been on any water pills recently but was previously years ago.   She reports that her maternal first cousin did have cancer. She is unaware of any other cancer in the family. Patient has a fhx of sickle cell disease and thalassemia, including in her cousin and aunt. Patient does not have a hx of sickle cell disease or thalassemia.  Patient lives by herself in an independent living facility. Her neighbors generally provide meals for her.   Patient reports that a few months ago a doctor found a thyroid  nodule on her neck. She reports that she has been putting wrinkle cream on her neck regularly and never noticed it.   Her last mammogram was in September and showed normal findings. There were findings of polyps in her colon a few years ago. Patient had a colonoscopy last year which showed no concerns. She did previously have an endoscopy.  No new bone pain in spine, shoulders, or hips. No other new symptoms. She denies any abdominal pain.   INTERVAL HISTORY:  .Dana Beltran  I connected with Dana Beltran on 10/14/2023 at 12:30 PM EDT by telephone visit and verified that I am speaking with the correct person using two identifiers.   I discussed the limitations, risks, security and privacy concerns of performing an evaluation and management service by telemedicine and the availability of in-person appointments. I also discussed with the patient that there may be a patient responsible charge related to this service. The patient expressed  understanding and agreed to proceed.   Other persons participating in the visit and their role in the encounter: none   Patient's location: Home  Provider's location: [facility]   Chief Complaint: f/u for MGUS and Anemia.  Patient notes no acute new symptoms since her last clinic visit. No new issues abnormal bleeding or bruising. No acute new fatigue. Notes that she has some upcoming dental procedure. Labs done today were discussed with her in detail.  MEDICAL HISTORY:  Past Medical History:  Diagnosis Date   Allergy 1959   Tested at 12 years   Anemia 2025   Tested positive   Arthritis 2024   Tested   Cataract disorder type 14    CHF (congestive heart failure) (HCC) 2016   Heart Attack   Coronary artery disease    Coronary artery disease    Diabetes mellitus without complication (HCC)    Hypertension    Hypovolemia    Leg edema, left    after door fell on her at work    MI, old    Mixed hyperlipidemia    Snoring     SURGICAL HISTORY: Past Surgical History:  Procedure Laterality Date   CARDIAC SURGERY     Stent placement x4   EYE SURGERY  2023   [place]   pericardial effusion      SOCIAL HISTORY: Social History   Socioeconomic History   Marital status: Divorced    Spouse name: Not on file   Number of children: 0   Years of education: Not on file   Highest education level: 12th grade  Occupational History   Not on file  Tobacco Use   Smoking status: Never   Smokeless tobacco: Never   Tobacco comments:    Never smoked  Vaping Use   Vaping status: Never Used  Substance and Sexual Activity   Alcohol use: Never   Drug use: Never   Sexual activity: Not Currently    Birth control/protection: Abstinence  Other Topics Concern   Not on file  Social History Narrative   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    Social Drivers of Health   Financial Resource Strain: Medium Risk (05/19/2023)   Overall Financial Resource Strain (CARDIA)    Difficulty of Paying  Living Expenses: Somewhat hard  Food Insecurity: Food Insecurity Present (05/19/2023)   Hunger Vital Sign    Worried About Running Out of Food in the Last Year: Often true    Ran Out of Food in the Last Year: Sometimes true  Transportation Needs: No Transportation Needs (05/19/2023)   PRAPARE - Administrator, Civil Service (Medical): No    Lack of Transportation (Non-Medical): No  Physical Activity: Sufficiently Active (05/19/2023)   Exercise Vital Sign    Days of Exercise per Week: 7 days    Minutes of Exercise per Session: 30 min  Stress: Stress Concern Present (05/19/2023)   [employer] of Occupational Health - Occupational Stress Questionnaire    Feeling of Stress : To some extent  Social Connections: Unknown (05/19/2023)   Social  Connection and Isolation Panel    Frequency of Communication with Friends and Family: More than three times a week    Frequency of Social Gatherings with Friends and Family: More than three times a week    Attends Religious Services: Patient declined    Active Member of Clubs or Organizations: Yes    Attends Engineer, structural: More than 4 times per year    Marital Status: Divorced  Intimate Partner Violence: Not At Risk (10/29/2022)   Humiliation, Afraid, Rape, and Kick questionnaire    Fear of Current or Ex-Partner: No    Emotionally Abused: No    Physically Abused: No    Sexually Abused: No    FAMILY HISTORY: Family History  Problem Relation Age of Onset   High blood pressure Father    Arthritis Father     ALLERGIES:  is allergic to coconut (cocos nucifera), orange oil, pineapple, prunus persica, strawberry extract, tomato, chocolate, citrus, dust mite extract, ezetimibe , other, and peach flavoring agent (non-screening).   MEDICATIONS:  Current Outpatient Medications  Medication Sig Dispense Refill   Ascorbic Acid (VITAMIN C) 1000 MG tablet Take 1,000 mg by mouth daily. Take 2 tablets daily     ASPIRIN  81 PO Take  81 mg by mouth daily.     atorvastatin  (LIPITOR) 80 MG tablet Take 1 tablet (80 mg total) by mouth daily. 90 tablet 3   Cholecalciferol (VITAMIN D-3 PO) Take by mouth daily.     Cyanocobalamin  (VITAMIN B 12 PO) Take by mouth daily.     FARXIGA  10 MG TABS tablet Take 1 tablet (10 mg total) by mouth daily before breakfast. 90 tablet 3   furosemide  (LASIX ) 20 MG tablet Take 1 tablet (20 mg total) by mouth daily. 90 tablet 3   metoprolol  succinate (TOPROL -XL) 25 MG 24 hr tablet Take 1 tablet (25 mg total) by mouth daily. Take with or immediately following a meal. 90 tablet 3   spironolactone  (ALDACTONE ) 25 MG tablet Take 1 tablet (25 mg total) by mouth daily. 90 tablet 3   UNABLE TO FIND Take 1 Bottle by mouth as directed. Med Name: Magic Mouthwash   1 part 2% viscous lidocaine, 1 part maalox, 1 part diphenhydramine , 1 part nystatin suspension  Rinse mouth with capful after meals and when pain is present PRN. (Patient not taking: Reported on 08/26/2023)     valsartan-hydrochlorothiazide (DIOVAN-HCT) 320-12.5 MG tablet TAKE 1 TABLET BY MOUTH  DAILY 90 tablet 3   No current facility-administered medications for this visit.    REVIEW OF SYSTEMS:    .10 Point review of Systems was done is negative except as noted above.   PHYSICAL EXAMINATION: Telemedicine visit  LABORATORY DATA:  I have reviewed the data as listed  .    Latest Ref Rng & Units 10/14/2023    8:10 AM 07/14/2023    7:46 AM 04/01/2023    8:57 AM  CBC  WBC 4.0 - 10.5 K/uL 2.7  3.4  4.1   Hemoglobin 12.0 - 15.0 g/dL 9.3  9.8  9.1   Hematocrit 36.0 - 46.0 % 27.4  28.1  26.7   Platelets 150 - 400 K/uL 150  165  208    .CBC    Component Value Date/Time   WBC 2.7 (L) 10/14/2023 0810   WBC 3.5 (L) 12/17/2022 0930   RBC 3.36 (L) 10/14/2023 0810   HGB 9.3 (L) 10/14/2023 0810   HGB 8.0 (L) 12/10/2022 1025   HCT 27.4 (L) 10/14/2023  0810   HCT 23.8 (L) 12/10/2022 1025   PLT 150 10/14/2023 0810   PLT 327 12/10/2022 1025   MCV  81.5 10/14/2023 0810   MCV 82 12/10/2022 1025   MCH 27.7 10/14/2023 0810   MCHC 33.9 10/14/2023 0810   RDW 16.8 (H) 10/14/2023 0810   RDW 20.0 (H) 12/10/2022 1025   LYMPHSABS 1.1 10/14/2023 0810   MONOABS 0.4 10/14/2023 0810   EOSABS 0.1 10/14/2023 0810   BASOSABS 0.0 10/14/2023 0810    .    Latest Ref Rng & Units 10/14/2023    8:10 AM 07/14/2023    7:46 AM 04/01/2023    8:57 AM  CMP  Glucose 70 - 99 mg/dL 81  87  90   BUN 8 - 23 mg/dL 37  39  34   Creatinine 0.44 - 1.00 mg/dL 8.66  8.45  8.46   Sodium 135 - 145 mmol/L 138  136  136   Potassium 3.5 - 5.1 mmol/L 4.1  3.8  4.0   Chloride 98 - 111 mmol/L 105  103  101   CO2 22 - 32 mmol/L 29  26  29    Calcium  8.9 - 10.3 mg/dL 9.2  9.5  9.0   Total Protein 6.5 - 8.1 g/dL 7.1  7.4  6.8   Total Bilirubin 0.0 - 1.2 mg/dL 0.4  0.5  0.4   Alkaline Phos 38 - 126 U/L 87  87  69   AST 15 - 41 U/L 23  16  16    ALT 0 - 44 U/L 23  14  14     . Lab Results  Component Value Date   IRON  51 10/14/2023   TIBC 244 (L) 10/14/2023   IRONPCTSAT 21 10/14/2023   (Iron  and TIBC)  Lab Results  Component Value Date   FERRITIN 457 (H) 10/14/2023    Fish analysis MDS standard 01/21/2023:      12/17/2022 FISH Analysis Plasma cell Myeloma Prognostic Panel:       Lab work from 09/05/2022:         RADIOGRAPHIC STUDIES: I have personally reviewed the radiological images as listed and agreed with the findings in the report. No results found.   ASSESSMENT & PLAN:   78 y.o. 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 Overall consistent with MGUS. 3. Renal insufficiency likely due to high blood pressure and diabetes.  PLAN: Discussed available laboratories in detail with the patient CBC shows stable chronic  anemia with a hemoglobin of 9.3 with a WBC count of 2.7k and platelets of 150k CMP shows mild chronic kidney disease with a creatinine of 1.33. Ferritin is 457 with an iron  saturation of 21% LDH within normal limits at 133 No indication for IV iron  at this time Will need to check SPEP with his next visit. If the hemoglobin is less than he might need to consider EPO FOLLOW-UP: RTC with Dr Dana in 4 months Labs 7-10 days prior to clinic visit  The total time spent in the appointment was 20 minutes*.  All of the patient's questions were answered with apparent satisfaction. The patient knows to call the clinic with any problems, questions or concerns.   Emaline Onesimo MD MS AAHIVMS [facility] Hematology/Oncology Physician [facility]  .*Total Encounter  Time as defined by the Centers for Medicare and Medicaid Services includes, in addition to the face-to-face time of a patient visit (documented in the note above) non-face-to-face time: obtaining and reviewing outside history, ordering and reviewing medications, tests or procedures, care coordination (communications with other health care professionals or caregivers) and documentation in the medical record.

## 2023-11-23 ENCOUNTER — Other Ambulatory Visit (HOSPITAL_COMMUNITY): Payer: Self-pay | Admitting: Cardiology

## 2023-11-26 ENCOUNTER — Telehealth (HOSPITAL_COMMUNITY): Payer: Self-pay | Admitting: Cardiology

## 2023-11-27 DIAGNOSIS — Z1231 Encounter for screening mammogram for malignant neoplasm of breast: Secondary | ICD-10-CM | POA: Diagnosis not present

## 2023-11-27 LAB — HM MAMMOGRAPHY

## 2023-11-30 ENCOUNTER — Encounter: Payer: Self-pay | Admitting: Family

## 2023-12-04 ENCOUNTER — Encounter (HOSPITAL_COMMUNITY): Payer: Self-pay | Admitting: Cardiology

## 2023-12-04 ENCOUNTER — Ambulatory Visit (HOSPITAL_COMMUNITY)
Admission: RE | Admit: 2023-12-04 | Discharge: 2023-12-04 | Disposition: A | Discharge status: Home / Self Care | Source: Ambulatory Visit | Attending: Cardiology | Admitting: Cardiology

## 2023-12-04 VITALS — BP 148/98 | HR 59 | Ht 62.0 in | Wt 152.4 lb

## 2023-12-04 DIAGNOSIS — I25118 Atherosclerotic heart disease of native coronary artery with other forms of angina pectoris: Secondary | ICD-10-CM | POA: Diagnosis not present

## 2023-12-04 DIAGNOSIS — I5032 Chronic diastolic (congestive) heart failure: Secondary | ICD-10-CM

## 2023-12-04 NOTE — Patient Instructions (Signed)
  Follow-Up in: AS NEEDED   PLEASE CONTINUE TO FOLLOW UP WITH DR. [name]  At the [facility], you and your health needs are our priority. We have a designated team specialized in the treatment of Heart Failure. This Care Team includes your primary Heart Failure Specialized Cardiologist (physician), Advanced Practice Providers (APPs- Physician Assistants and Nurse Practitioners), and Pharmacist who all work together to provide you with the care you need, when you need it.   You may see any of the following providers on your designated Care Team at your next follow up:  Dr. Toribio Fuel Dr. Ezra Shuck Dr. Odis Brownie Greig Mosses, NP Caffie Shed, [place] [facility] [place], [place] Beckey Coe, NP Jordan Lee, NP Tinnie Redman, PharmD   Please be sure to bring in all your medications bottles to every appointment.   Need to Contact Us :  If you have any questions or concerns before your next appointment please send us  a message through [place] or call our office at [phone].    TO LEAVE A MESSAGE FOR THE NURSE SELECT OPTION 2, PLEASE LEAVE A MESSAGE INCLUDING: YOUR NAME DATE OF BIRTH CALL BACK NUMBER REASON FOR CALL**this is important as we prioritize the call backs  YOU WILL RECEIVE A CALL BACK THE SAME DAY AS LONG AS YOU CALL BEFORE 4:00 PM

## 2023-12-04 NOTE — Progress Notes (Signed)
   ADVANCED HEART FAILURE FOLLOW UP CLINIC NOTE  Referring Physician: Leonarda [name], NP  Primary Care: [name], [name], NP Primary Cardiologist:  HPI: Dana Beltran is a 78 y.o. female with PMH of LVH, HFpEF, CKD, likely MGUS, hypertension, CAD s/p RCA and LAD PCI who presents for follow up of heart failure with preserved EF.      Patient has a known history of coronary artery disease with LAD and RCA PCI in 2016. She was very active through her church, worked as a runner, broadcasting/film/video, and was generally feeling well until around September of this year. At that time she began to have progressive shortness of breath and dyspnea on exertion as well as limiting fatigue. She has undergone significant workup including bone marrow biopsy, nuclear stress test, and echocardiogram. She was referred to heart failure clinic given concern for cardiac amyloid.      SUBJECTIVE:  Patient overall feeling well, she denies any issues or complaint since her last visit.  Her lower extremity swelling and dyspnea has significantly improved.  She denies any limiting symptoms.  Blood pressures been better controlled at home, was rushing this morning and did not take her pills.  PMH, current medications, allergies, social history, and family history reviewed in epic.  PHYSICAL EXAM: Vitals:   12/04/23 0842  BP: (!) 148/98  Pulse: (!) 59  SpO2: 99%    GENERAL: NAD, well appearing PULM:  Normal work of breathing, CTAB CARDIAC:  JVP: flat         Normal rate with regular rhythm. No murmurs, rubs or gallops.  No edema. Warm and well perfused extremities. ABDOMEN: Soft, non-tender, non-distended. NEUROLOGIC: Patient is oriented x3 with no focal or lateralizing neurologic deficits.     DATA REVIEW  ECG: 02/2023: NSR with LAE, LVH     ECHO: 10/30/22: Severe LVH, reported normal diastology, AV calcifications   CATH: Last in 2016, RCA and LAD PCI Normal recent Lexiscan    CMR: Normal LV, RVEF, no evidence of  infiltrative disease, HTN disease  PYP: Negative  Heart failure review: - Classification: Heart failure with preserved EF - Etiology: Suspected HTN related - NYHA Class: III - Volume status: Hypervolemic  ASSESSMENT & PLAN:  Heart failure with preserved EF: TTR workup negative, feeling improved on current therapy.  She appears euvolemic and has no active complaints. -Continue Lasix  20 mg daily, appears euvolemic -Continue Farxiga  10 mg daily -Continue spironolactone  25 mg daily -Continue metoprolol  succinate 25 mg daily -Continue valsartan hydrochlorothiazide 320-12.5 mg daily - If recurrent swelling or edema could consider transition to Entresto in the future.  CAD: No active chest pain or pressure. Recent Lexiscan  was normal.  - Continue aspirin , statin   MGUS: Noted on amyloid workup, not concerning for AL disease.  - Follows with hematology   CKD: Stage II, recent improvement in creatinine. - SGLT-2 as above   Follow up prn  [name], MD Advanced Heart Failure Mechanical Circulatory Support 12/17/23

## 2023-12-23 ENCOUNTER — Ambulatory Visit: Admitting: Family

## 2023-12-24 ENCOUNTER — Ambulatory Visit: Admitting: Family

## 2023-12-24 ENCOUNTER — Encounter: Payer: Self-pay | Admitting: Family

## 2023-12-24 VITALS — BP 140/80 | HR 62 | Temp 98.4°F | Ht 62.0 in | Wt 145.8 lb

## 2023-12-24 DIAGNOSIS — R1310 Dysphagia, unspecified: Secondary | ICD-10-CM | POA: Diagnosis not present

## 2023-12-24 DIAGNOSIS — H6123 Impacted cerumen, bilateral: Secondary | ICD-10-CM

## 2023-12-24 DIAGNOSIS — J029 Acute pharyngitis, unspecified: Secondary | ICD-10-CM | POA: Diagnosis not present

## 2023-12-24 DIAGNOSIS — R591 Generalized enlarged lymph nodes: Secondary | ICD-10-CM | POA: Diagnosis not present

## 2023-12-24 DIAGNOSIS — R634 Abnormal weight loss: Secondary | ICD-10-CM

## 2023-12-24 LAB — POCT RAPID STREP A (OFFICE): Rapid Strep A Screen: NEGATIVE

## 2023-12-24 MED ORDER — ENSURE ACTIVE HIGH PROTEIN PO LIQD: 1.0000 | Freq: Every day | ORAL | 5 refills | Status: AC

## 2023-12-31 ENCOUNTER — Other Ambulatory Visit (HOSPITAL_COMMUNITY): Payer: Self-pay | Admitting: Cardiology

## 2023-12-31 ENCOUNTER — Other Ambulatory Visit: Payer: Self-pay | Admitting: Cardiology

## 2023-12-31 NOTE — Progress Notes (Signed)
 Provider: Roxan Plough FNP-C  Madelynne Lasker, [name], NP  Patient Care Team: Logyn Dedominicis, [name], NP as PCP - General (Family Medicine) Elmira [name], MD as PCP - Cardiology (Cardiology) [name], MD as Consulting Physician (Hematology)  Extended Emergency Contact Information Primary Emergency Contact: Mills,Diana Home Phone: [phone] Mobile Phone: [phone] Relation: Friend Interpreter needed? No Secondary Emergency Contact: [facility] Phone: [phone] Mobile Phone: [phone] Relation: Relative  Code Status: Full code Goals of care: Advanced Directive information    08/26/2023    9:40 AM  Advanced Directives  Does Patient Have a Medical Advance Directive? No  Would patient like information on creating a medical advance directive? No - Patient declined     Chief Complaint  Patient presents with   Throat issue    Throat issue has been going on for about a month.   Discussed the use of AI scribe software for clinical note transcription with the patient, who gave verbal consent to proceed.  History of Present Illness   Dana Beltran is a 78 year old female who presents with concerns about nodules in her throat and mouth.  She has two persistent nodules on the side of her throat that were previously biopsied and tested for thyroid  issues, with negative results. Recently, she noticed two additional nodules on her tongue that have not resolved. She visited a dentist who referred her to a periodontist, but the periodontist retired before any treatment could be provided. She is currently seeking another periodontist in [place] due to transportation limitations.  She is concerned about her throat 'starting to close again' on one side, making swallowing difficult, especially with pills. She has experienced significant weight loss, dropping from 199 to 130 pounds, and is trying to regain weight to reach 150 pounds. She currently weighs 140 pounds  and eats two meals a day, typically skipping breakfast due to her schedule. No sore throat or acid reflux, but she feels nervous about the possibility of her throat closing again. She has not seen a gastroenterologist for her swallowing issues but has seen various other specialists.  She has a history of iron  deficiency anemia and is currently feeling cold despite others being warm, indicating ongoing anemia. She has seen multiple specialists, including an oncologist for anemia, a cardiologist, and a kidney specialist. She recalls having a thyroid  ultrasound, but the results are pending.  She is concerned about the nodules in her mouth, which sometimes bleed when she brushes her teeth. She describes them as 'puffy' and notes that one has reduced in size while the others remain unchanged. She experiences difficulty swallowing solid foods, similar to her issues with pills, and often follows food with fluids to aid swallowing.  She does not drive and relies on public transportation limited to [place]. She is currently attending school and has a structured eating schedule due to her commitments.   Past Medical History:  Diagnosis Date   Allergy 1959   Tested at 12 years   Anemia 2025   Tested positive   Arthritis 2024   Tested   Cataract disorder type 14    CHF (congestive heart failure) (HCC) 2016   Heart Attack   Coronary artery disease    Coronary artery disease    Diabetes mellitus without complication (HCC)    Hypertension    Hypovolemia    Leg edema, left    after door fell on her at work    MI, old    Mixed hyperlipidemia  Snoring    Past Surgical History:  Procedure Laterality Date   CARDIAC SURGERY     Stent placement x4   EYE SURGERY  2023   [place]   pericardial effusion      Allergies  Allergen Reactions   Coconut (Cocos Nucifera) Rash   Orange Oil Rash   Pineapple Rash   Prunus Persica Rash   Strawberry Extract Rash and Hives   Tomato Rash   Chocolate  Rash and Hives   Citrus Rash and Hives   Dust Mite Extract Hives   Ezetimibe  Rash   Other Hives   Peach Flavoring Agent (Non-Screening) Rash    Outpatient Encounter Medications as of 12/24/2023  Medication Sig   Ascorbic Acid (VITAMIN C) 1000 MG tablet Take 1,000 mg by mouth daily. Take 2 tablets daily   ASPIRIN  81 PO Take 81 mg by mouth daily.   atorvastatin  (LIPITOR) 80 MG tablet Take 1 tablet (80 mg total) by mouth daily.   Cholecalciferol (VITAMIN D-3 PO) Take by mouth daily.   Cyanocobalamin  (VITAMIN B 12 PO) Take by mouth daily.   dapagliflozin propanediol  (FARXIGA ) 10 MG TABS tablet Take 1 tablet (10 mg total) by mouth daily before breakfast. PLEASE SCHEDULE APPOINTMENT FOR MORE REFILLS   furosemide  (LASIX ) 20 MG tablet Take 1 tablet (20 mg total) by mouth daily. PLEASE SCHEDULE APPOINTMENT FOR MORE REFILLS   metoprolol  succinate (TOPROL -XL) 25 MG 24 hr tablet Take 1 tablet (25 mg total) by mouth daily. Take with or immediately following a meal.   Nutritional Supplements (ENSURE ACTIVE HIGH PROTEIN) LIQD Take 1 Can by mouth daily.   spironolactone  (ALDACTONE ) 25 MG tablet Take 1 tablet (25 mg total) by mouth daily.   valsartan-hydrochlorothiazide (DIOVAN-HCT) 320-12.5 MG tablet TAKE 1 TABLET BY MOUTH  DAILY   No facility-administered encounter medications on file as of 12/24/2023.    Review of Systems  Constitutional:  Negative for appetite change, chills, fatigue, fever and unexpected weight change.  HENT:  Positive for sore throat and trouble swallowing. Negative for congestion, dental problem, ear discharge, ear pain, facial swelling, hearing loss, nosebleeds, postnasal drip, rhinorrhea, sinus pressure, sinus pain, sneezing and tinnitus.   Eyes:  Negative for pain, discharge, redness, itching and visual disturbance.  Respiratory:  Negative for cough, chest tightness, shortness of breath and wheezing.   Cardiovascular:  Negative for chest pain, palpitations and leg swelling.   Gastrointestinal:  Negative for abdominal distention, abdominal pain, blood in stool, constipation, diarrhea, nausea and vomiting.  Genitourinary:  Negative for difficulty urinating, dysuria, flank pain, frequency and urgency.  Musculoskeletal:  Negative for arthralgias, back pain, gait problem, joint swelling, myalgias, neck pain and neck stiffness.  Skin:  Negative for color change, pallor and rash.  Neurological:  Negative for dizziness, syncope, speech difficulty, weakness, light-headedness, numbness and headaches.  Hematological:  Does not bruise/bleed easily.       Swollen lymph nodes  Psychiatric/Behavioral:  Negative for agitation, behavioral problems, confusion, hallucinations, self-injury, sleep disturbance and suicidal ideas. The patient is not nervous/anxious.     Immunization History  Administered Date(s) Administered   Fluad Quad(high Dose 65+) 10/20/2018   INFLUENZA, HIGH DOSE SEASONAL PF 12/12/2020, 10/30/2021, 11/07/2022   Moderna Covid-19 Fall Seasonal Vaccine 42yrs & older 11/07/2022   Pneumococcal Conjugate-13 10/27/2016   Tdap 10/27/2016   Pertinent  Health Maintenance Due  Topic Date Due   Bone Density Scan  Never done   Influenza Vaccine  09/04/2023   Mammogram  Discontinued  10/30/2018    8:09 AM 05/20/2023    9:51 AM 06/09/2023    8:30 AM 06/23/2023    8:41 AM 08/26/2023    9:37 AM  Fall Risk  Falls in the past year?  0 0 0 0  Was there an injury with Fall?  0 0  0  Fall Risk Category Calculator  0   0  (RETIRED) Patient Fall Risk Level Low fall risk       Patient at Risk for Falls Due to  No Fall Risks  No Fall Risks No Fall Risks  Fall risk Follow up  Falls evaluation completed  Falls evaluation completed Falls evaluation completed     Data saved with a previous flowsheet row definition   Functional Status Survey:    Vitals:   12/24/23 1001 12/24/23 1020  BP: (!) 154/100 (!) 140/80  Pulse: 62   Temp: 98.4 F (36.9 C)   SpO2: 99%   Weight:  145 lb 12.8 oz (66.1 kg)   Height: 5' 2 (1.575 m)    Body mass index is 26.67 kg/m. Physical Exam  VITALS: T- 98.4, P- 62, BP- 140/80, SaO2- 99% MEASUREMENTS: Weight- 140. GENERAL: Alert, cooperative, well developed, no acute distress. HEENT: Normocephalic, normal oropharynx, moist mucous membranes, cerumen impaction in right ear with eardrum not fully visible, nodules on both sides of throat with left side larger, no sinus tenderness. NECK: Neck supple, no cervical adenopathy. CHEST: Clear to auscultation bilaterally, no wheezes, rhonchi, or crackles. CARDIOVASCULAR: Normal heart rate and rhythm, S1 and S2 normal without murmurs. ABDOMEN: Soft, non-tender, non-distended, without organomegaly, normal bowel sounds. EXTREMITIES: No cyanosis or edema. NEUROLOGICAL: Cranial nerves grossly intact, moves all extremities without gross motor or sensory deficit.  SKIN: No rash,no lesion or erythema   PSYCHIATRY/BEHAVIORAL: Mood stable   Labs reviewed: Recent Labs    04/01/23 0857 07/14/23 0746 10/14/23 0810  NA 136 136 138  K 4.0 3.8 4.1  CL 101 103 105  CO2 29 26 29   GLUCOSE 90 87 81  BUN 34* 39* 37*  CREATININE 1.53* 1.54* 1.33*  CALCIUM  9.0 9.5 9.2   Recent Labs    04/01/23 0857 07/14/23 0746 10/14/23 0810  AST 16 16 23   ALT 14 14 23   ALKPHOS 69 87 87  BILITOT 0.4 0.5 0.4  PROT 6.8 7.4 7.1  ALBUMIN  3.6 4.0 3.9   Recent Labs    04/01/23 0857 07/14/23 0746 10/14/23 0810  WBC 4.1 3.4* 2.7*  NEUTROABS 2.2 1.6* 1.2*  HGB 9.1* 9.8* 9.3*  HCT 26.7* 28.1* 27.4*  MCV 83.4 78.9* 81.5  PLT 208 165 150   Lab Results  Component Value Date   TSH 2.05 08/26/2023   Lab Results  Component Value Date   HGBA1C 5.5 10/30/2022   Lab Results  Component Value Date   CHOL 195 08/26/2023   HDL 77 08/26/2023   LDLCALC 105 (H) 08/26/2023   TRIG 50 08/26/2023   CHOLHDL 2.5 08/26/2023    Significant Diagnostic Results in last 30 days:  No results  found.  Assessment/Plan  Dysphagia with associated unintentional weight loss Dysphagia with sensation of food and pills getting stuck, leading to unintentional weight loss from 199 lbs to 140 lbs. No acid reflux reported. Concerns about potential recurrence of previous throat closure. No prior gastroenterology evaluation for swallowing issues. 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 Previous biopsies and thyroid  tests were negative. No tenderness or fever, reducing likelihood of infection. Concerns about potential causes including medication or gum issues. - Referred to ear, nose, and throat specialist for evaluation of oral mucosal nodules and lymph nodes  Bilateral impacted cerumen Impacted cerumen in both ears, with right ear eardrum not fully visible. Declines ear lavage and prefers ENT evaluation for removal. - Referred to ear, nose, and throat specialist for removal of impacted cerumen  Iron  deficiency anemia Ongoing iron  deficiency anemia with symptoms of feeling cold and needing to wear multiple layers. Previous evaluation by oncologist for anemia. - Continue follow-up with oncologist for management of iron  deficiency anemia   Family/ staff Communication: Reviewed plan of care with patient Levonest understanding  Labs/tests ordered:  POC rapid Strep  Next Appointment: Return if symptoms worsen or fail to improve.  Total time: 22 minutes. Greater than 50% of total time spent doing patient education regarding Dysphagia with associated unintentional weight loss,Oral mucosal nodules and enlarged cervical lymph nodes,Iron  deficiency anemia,health maintenance including symptom/medication management.   Roxan JAYSON Plough, NP

## 2024-01-08 ENCOUNTER — Encounter (INDEPENDENT_AMBULATORY_CARE_PROVIDER_SITE_OTHER): Payer: Self-pay

## 2024-02-07 ENCOUNTER — Other Ambulatory Visit (HOSPITAL_COMMUNITY): Payer: Self-pay | Admitting: Cardiology

## 2024-02-15 ENCOUNTER — Telehealth: Payer: Self-pay

## 2024-02-15 NOTE — Telephone Encounter (Signed)
 Copied from CRM #[id number]. Topic: Referral - Question >> Feb 15, 2024 10:45 AM Mercer PEDLAR wrote: Reason for CRM: Patient stated that the soonest appointment available at [facility] for her gastroenterologist appointment is for 08/30/24 and they wanted her to schedule a virtual visit. She is requesting a callback to discuss other options because she does not think she can wait that long to see a specialist and patient stated that she needs the appointment to be in person to be examined.

## 2024-02-15 NOTE — Telephone Encounter (Signed)
 Notify provider if another referred is desired to another Gastroenterology.

## 2024-02-16 NOTE — Telephone Encounter (Signed)
 Left a detail voicemail in regards to [name], Dinah C, NP response to ask the patient is there another place to go the Gastroenterology.  E2C2 it is okay to share the response of the provider with the patient. If patient has any questions or concerns please have the patient to call the office at [phone]

## 2024-02-17 ENCOUNTER — Inpatient Hospital Stay: Attending: Family

## 2024-02-18 ENCOUNTER — Telehealth: Payer: Self-pay

## 2024-02-18 DIAGNOSIS — R1310 Dysphagia, unspecified: Secondary | ICD-10-CM

## 2024-02-18 NOTE — Telephone Encounter (Signed)
 Copied from CRM #[id number]. Topic: General - Other >> Feb 18, 2024  4:01 PM Cherylann RAMAN wrote: Reason for CRM: Patient states that yes she would like to [facility] as soon as possible. She states that her appt Monday - Thursday should be after 2 pm and on Friday at any time due to attending classes on Monday-Thursday. Prefers appt to be in [place] due to taking SCAT and prefers early mornings on Friday but any time would do. Patient can be contacted at [phone]

## 2024-02-23 ENCOUNTER — Telehealth: Payer: Self-pay | Admitting: Hematology

## 2024-02-23 NOTE — Telephone Encounter (Signed)
 I received a voicemail from this patient needing to reschedule her appt on 1/21. I called and spoke with the patient, she has been rescheduled and she is aware.

## 2024-02-24 ENCOUNTER — Inpatient Hospital Stay: Admitting: Hematology

## 2024-02-25 ENCOUNTER — Telehealth: Payer: Self-pay

## 2024-03-01 ENCOUNTER — Ambulatory Visit: Payer: Self-pay | Admitting: Family

## 2024-03-01 ENCOUNTER — Ambulatory Visit: Admitting: Family

## 2024-03-08 NOTE — Telephone Encounter (Signed)
 Please advise....  Copied from CRM #[id number]. Topic: Referral - Question >> Mar 04, 2024  4:24 PM Debby BROCKS wrote: Reason for CRM: [facility] physicians reached out to the patient due to a referral but the patient told them that she wants to keep [facility]. She was advised by [facility] physicians to reach out to PCP [facility]) and have the letter/referral be sent out to [facility] instead

## 2024-03-08 NOTE — Telephone Encounter (Signed)
 Please forward GI referral to [facility] as requested by patient.

## 2024-03-09 NOTE — Telephone Encounter (Signed)
 Referral to [facility] closed per patient request to stay with her current provider at [facility]. Patient has appointment with [facility] scheduled via the referral sent to them Nov 2025.

## 2024-03-09 NOTE — Telephone Encounter (Signed)
 Noted. Thank You

## 2024-03-16 ENCOUNTER — Inpatient Hospital Stay

## 2024-03-16 ENCOUNTER — Inpatient Hospital Stay: Admitting: Hematology
